# Patient Record
Sex: Female | Born: 1995 | Race: Black or African American | Hispanic: No | Marital: Single | Smoking: Former smoker
Health system: Southern US, Community
[De-identification: ages and names within clinical notes are randomized; demographics above are authoritative.]

## PROBLEM LIST (undated history)

## (undated) ENCOUNTER — Inpatient Hospital Stay (HOSPITAL_COMMUNITY): Payer: Self-pay

## (undated) DIAGNOSIS — N390 Urinary tract infection, site not specified: Secondary | ICD-10-CM

## (undated) DIAGNOSIS — B999 Unspecified infectious disease: Secondary | ICD-10-CM

## (undated) DIAGNOSIS — Z789 Other specified health status: Secondary | ICD-10-CM

## (undated) HISTORY — PX: NO PAST SURGERIES: SHX2092

---
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
## 2013-10-06 ENCOUNTER — Inpatient Hospital Stay (HOSPITAL_COMMUNITY)
Admission: AD | Admit: 2013-10-06 | Discharge: 2013-10-06 | Disposition: A | Discharge status: Home / Self Care | Payer: Medicaid Other | Source: Ambulatory Visit | Attending: Obstetrics | Admitting: Obstetrics

## 2013-10-06 ENCOUNTER — Encounter (HOSPITAL_COMMUNITY): Payer: Self-pay | Admitting: *Deleted

## 2013-10-06 DIAGNOSIS — N390 Urinary tract infection, site not specified: Secondary | ICD-10-CM | POA: Insufficient documentation

## 2013-10-06 DIAGNOSIS — B3731 Acute candidiasis of vulva and vagina: Secondary | ICD-10-CM | POA: Insufficient documentation

## 2013-10-06 DIAGNOSIS — N898 Other specified noninflammatory disorders of vagina: Secondary | ICD-10-CM | POA: Insufficient documentation

## 2013-10-06 DIAGNOSIS — B373 Candidiasis of vulva and vagina: Secondary | ICD-10-CM | POA: Insufficient documentation

## 2013-10-06 DIAGNOSIS — N760 Acute vaginitis: Secondary | ICD-10-CM

## 2013-10-06 LAB — URINE MICROSCOPIC-ADD ON

## 2013-10-06 LAB — URINALYSIS, ROUTINE W REFLEX MICROSCOPIC
BILIRUBIN URINE: NEGATIVE
Glucose, UA: NEGATIVE mg/dL
KETONES UR: NEGATIVE mg/dL
Nitrite: POSITIVE — AB
Protein, ur: NEGATIVE mg/dL
Specific Gravity, Urine: 1.015 (ref 1.005–1.030)
UROBILINOGEN UA: 0.2 mg/dL (ref 0.0–1.0)
pH: 6.5 (ref 5.0–8.0)

## 2013-10-06 LAB — POCT PREGNANCY, URINE: Preg Test, Ur: NEGATIVE

## 2013-10-06 LAB — WET PREP, GENITAL: Trich, Wet Prep: NONE SEEN

## 2013-10-06 MED ORDER — CEPHALEXIN 500 MG PO CAPS: 500.0000 mg | ORAL_CAPSULE | Freq: Two times a day (BID) | ORAL | Status: DC

## 2013-10-06 MED ORDER — FLUCONAZOLE 150 MG PO TABS
150.0000 mg | ORAL_TABLET | Freq: Once | ORAL | Status: AC
  Administered 2013-10-06: 150 mg via ORAL
  Filled 2013-10-06: qty 1

## 2013-10-06 NOTE — MAU Provider Note (Signed)
History     CSN: [id number]  Arrival date and time: 10/06/13 2036   First Provider Initiated Contact with Patient 10/06/13 2139      Chief Complaint  Patient presents with  . Vaginal Itching   Vaginal Itching    Pt is a G1P1001 here with report of vaginal discharge x 3 days.  Discharge is described as thick and white with positive vaginal itching.  No reported odor.  Denies vaginal lesions.    Past Medical History  Diagnosis Date  . Medical history non-contributory     Past Surgical History  Procedure Laterality Date  . No past surgeries      Family History  Problem Relation Age of Onset  . Asthma Son     History  Substance Use Topics  . Smoking status: Never Smoker   . Smokeless tobacco: Not on file  . Alcohol Use: No    Allergies: No Known Allergies  No prescriptions prior to admission    Review of Systems  Genitourinary:       Vaginal discharge and itching  All other systems reviewed and are negative.  Physical Exam   Last menstrual period 08/16/2013.  Physical Exam  Constitutional: She is oriented to person, place, and time. She appears well-developed and well-nourished. No distress.  HENT:  Head: Normocephalic.  Neck: Normal range of motion. Neck supple.  Cardiovascular: Normal rate, regular rhythm and normal heart sounds.   Respiratory: Effort normal and breath sounds normal. No respiratory distress.  GI: Soft. There is no tenderness. There is no CVA tenderness.  Genitourinary: Cervix exhibits no motion tenderness and no discharge. No bleeding around the vagina. Vaginal discharge (white, thick discharge) found.  Musculoskeletal: Normal range of motion.  Neurological: She is alert and oriented to person, place, and time.  Skin: Skin is warm and dry.  Psychiatric: She has a normal mood and affect.    MAU Course  Procedures  Results for orders placed during the hospital encounter of 10/06/13 (from the past 24 hour(s))  URINALYSIS, ROUTINE  W REFLEX MICROSCOPIC     Status: Abnormal   Collection Time    10/06/13  8:45 PM      Result Value Ref Range   Color, Urine YELLOW  YELLOW   APPearance CLEAR  CLEAR   Specific Gravity, Urine 1.015  1.005 - 1.030   pH 6.5  5.0 - 8.0   Glucose, UA NEGATIVE  NEGATIVE mg/dL   Hgb urine dipstick TRACE (*) NEGATIVE   Bilirubin Urine NEGATIVE  NEGATIVE   Ketones, ur NEGATIVE  NEGATIVE mg/dL   Protein, ur NEGATIVE  NEGATIVE mg/dL   Urobilinogen, UA 0.2  0.0 - 1.0 mg/dL   Nitrite POSITIVE (*) NEGATIVE   Leukocytes, UA SMALL (*) NEGATIVE  URINE MICROSCOPIC-ADD ON     Status: Abnormal   Collection Time    10/06/13  8:45 PM      Result Value Ref Range   Squamous Epithelial / LPF RARE  RARE   WBC, UA 0-2  <3 WBC/hpf   RBC / HPF 0-2  <3 RBC/hpf   Bacteria, UA MANY (*) RARE  POCT PREGNANCY, URINE     Status: None   Collection Time    10/06/13  9:02 PM      Result Value Ref Range   Preg Test, Ur NEGATIVE  NEGATIVE  WET PREP, GENITAL     Status: Abnormal   Collection Time    10/06/13  9:49 PM  Result Value Ref Range   Yeast Wet Prep HPF POC FEW (*) NONE SEEN   Trich, Wet Prep NONE SEEN  NONE SEEN   Clue Cells Wet Prep HPF POC FEW (*) NONE SEEN   WBC, Wet Prep HPF POC FEW (*) NONE SEEN    Assessment and Plan  UTI Yeast Infection  Plan:  Discharge to home RX Keflex 500 mg BID x 7 days Urine culture sent Diflucan 150 mg given in Maternity Admissions Unit  Rochele Pages N 10/06/2013, 9:40 PM

## 2013-10-09 LAB — URINE CULTURE

## 2013-12-09 ENCOUNTER — Encounter (HOSPITAL_COMMUNITY): Payer: Self-pay | Admitting: *Deleted

## 2014-01-23 ENCOUNTER — Encounter (HOSPITAL_COMMUNITY): Payer: Self-pay | Admitting: *Deleted

## 2014-01-23 ENCOUNTER — Inpatient Hospital Stay (HOSPITAL_COMMUNITY): Payer: Medicaid Other

## 2014-01-23 ENCOUNTER — Inpatient Hospital Stay (HOSPITAL_COMMUNITY)
Admission: AD | Admit: 2014-01-23 | Discharge: 2014-01-23 | Disposition: A | Discharge status: Home / Self Care | Payer: Medicaid Other | Source: Ambulatory Visit | Attending: Obstetrics | Admitting: Obstetrics

## 2014-01-23 DIAGNOSIS — Z3A00 Weeks of gestation of pregnancy not specified: Secondary | ICD-10-CM | POA: Insufficient documentation

## 2014-01-23 DIAGNOSIS — O4691 Antepartum hemorrhage, unspecified, first trimester: Secondary | ICD-10-CM

## 2014-01-23 DIAGNOSIS — O3680X0 Pregnancy with inconclusive fetal viability, not applicable or unspecified: Secondary | ICD-10-CM

## 2014-01-23 DIAGNOSIS — O209 Hemorrhage in early pregnancy, unspecified: Secondary | ICD-10-CM | POA: Insufficient documentation

## 2014-01-23 LAB — URINE MICROSCOPIC-ADD ON

## 2014-01-23 LAB — CBC
HEMATOCRIT: 40.3 % (ref 36.0–46.0)
HEMOGLOBIN: 13.5 g/dL (ref 12.0–15.0)
MCH: 31.4 pg (ref 26.0–34.0)
MCHC: 33.5 g/dL (ref 30.0–36.0)
MCV: 93.7 fL (ref 78.0–100.0)
Platelets: 311 10*3/uL (ref 150–400)
RBC: 4.3 MIL/uL (ref 3.87–5.11)
RDW: 12.6 % (ref 11.5–15.5)
WBC: 5.6 10*3/uL (ref 4.0–10.5)

## 2014-01-23 LAB — URINALYSIS, ROUTINE W REFLEX MICROSCOPIC
Bilirubin Urine: NEGATIVE
Glucose, UA: NEGATIVE mg/dL
KETONES UR: 15 mg/dL — AB
LEUKOCYTES UA: NEGATIVE
Nitrite: NEGATIVE
PROTEIN: 100 mg/dL — AB
Specific Gravity, Urine: >1.03 — ABNORMAL HIGH (ref 1.005–1.030)
Urobilinogen, UA: 0.2 mg/dL (ref 0.0–1.0)
pH: 6 (ref 5.0–8.0)

## 2014-01-23 LAB — WET PREP, GENITAL
Clue Cells Wet Prep HPF POC: NONE SEEN
Trich, Wet Prep: NONE SEEN
Yeast Wet Prep HPF POC: NONE SEEN

## 2014-01-23 LAB — HCG, QUANTITATIVE, PREGNANCY: hCG, Beta Chain, Quant, S: 92 m[IU]/mL — ABNORMAL HIGH (ref <5)

## 2014-01-23 LAB — POCT PREGNANCY, URINE: Preg Test, Ur: POSITIVE — AB

## 2014-01-23 NOTE — Discharge Instructions (Signed)
°Ectopic Pregnancy °An ectopic pregnancy is when the fertilized egg attaches (implants) outside the uterus. Most ectopic pregnancies occur in the fallopian tube. Rarely do ectopic pregnancies occur on the ovary, intestine, pelvis, or cervix. In an ectopic pregnancy, the fertilized egg does not have the ability to develop into a normal, healthy baby.  °A ruptured ectopic pregnancy is one in which the fallopian tube gets torn or bursts and results in internal bleeding. Often there is intense abdominal pain, and sometimes, vaginal bleeding. Having an ectopic pregnancy can be life threatening. If left untreated, this dangerous condition can lead to a blood transfusion, abdominal surgery, or even death. °CAUSES  °Damage to the fallopian tubes is the suspected cause in most ectopic pregnancies.  °RISK FACTORS °Depending on your circumstances, the risk of having an ectopic pregnancy will vary. The level of risk can be divided into three categories. °High Risk °· You have gone through infertility treatment. °· You have had a previous ectopic pregnancy. °· You have had previous tubal surgery. °· You have had previous surgery to have the fallopian tubes tied (tubal ligation). °· You have tubal problems or diseases. °· You have been exposed to DES. DES is a medicine that was used until 1971 and had effects on babies whose mothers took the medicine. °· You become pregnant while using an intrauterine device (IUD) for birth control.  °Moderate Risk °· You have a history of infertility. °· You have a history of a sexually transmitted infection (STI). °· You have a history of pelvic inflammatory disease (PID). °· You have scarring from endometriosis. °· You have multiple sexual partners. °· You smoke.  °Low Risk °· You have had previous pelvic surgery. °· You use vaginal douching. °· You became sexually active before 20 years of age. °SIGNS AND SYMPTOMS  °An ectopic pregnancy should be suspected in anyone who has missed a period  and has abdominal pain or bleeding. °· You may experience normal pregnancy symptoms, such as: °¨ Nausea. °¨ Tiredness. °¨ Breast tenderness. °· Other symptoms may include: °¨ Pain with intercourse. °¨ Irregular vaginal bleeding or spotting. °¨ Cramping or pain on one side or in the lower abdomen. °¨ Fast heartbeat. °¨ Passing out while having a bowel movement. °· Symptoms of a ruptured ectopic pregnancy and internal bleeding may include: °¨ Sudden, severe pain in the abdomen and pelvis. °¨ Dizziness or fainting. °¨ Pain in the shoulder area. °DIAGNOSIS  °Tests that may be performed include: °· A pregnancy test. °· An ultrasound test. °· Testing the specific level of pregnancy hormone in the bloodstream. °· Taking a sample of uterus tissue (dilation and curettage, D&C). °· Surgery to perform a visual exam of the inside of the abdomen using a thin, lighted tube with a tiny camera on the end (laparoscope). °TREATMENT  °An injection of a medicine called methotrexate may be given. This medicine causes the pregnancy tissue to be absorbed. It is given if: °· The diagnosis is made early. °· The fallopian tube has not ruptured. °· You are considered to be a good candidate for the medicine. °Usually, pregnancy hormone blood levels are checked after methotrexate treatment. This is to be sure the medicine is effective. It may take 4-6 weeks for the pregnancy to be absorbed (though most pregnancies will be absorbed by 3 weeks). °Surgical treatment may be needed. A laparoscope may be used to remove the pregnancy tissue. If severe internal bleeding occurs, a cut (incision) may be made in the lower abdomen (laparotomy), and the ectopic   pregnancy is removed. This stops the bleeding. Part of the fallopian tube, or the whole tube, may be removed as well (salpingectomy). After surgery, pregnancy hormone tests may be done to be sure there is no pregnancy tissue left. You may receive a Rho (D) immune globulin shot if you are Rh negative  and the father is Rh positive, or if you do not know the Rh type of the father. This is to prevent problems with any future pregnancy. °SEEK IMMEDIATE MEDICAL CARE IF:  °You have any symptoms of an ectopic pregnancy. This is a medical emergency. °MAKE SURE YOU: °· Understand these instructions. °· Will watch your condition. °· Will get help right away if you are not doing well or get worse. °Document Released: 03/03/2004 Document Revised: 06/10/2013 Document Reviewed: 08/23/2012 °ExitCare® Patient Information ©2015 [employer]. This information is not intended to replace advice given to you by your health care provider. Make sure you discuss any questions you have with your health care provider. ° ° °

## 2014-01-23 NOTE — MAU Note (Signed)
Pt states she noticed vaginal bleeding yesterday around 1830 and then it started and pt states she just sees spotting now

## 2014-01-23 NOTE — MAU Provider Note (Signed)
History     CSN: [id number]  Arrival date and time: 01/23/14 1439   First Provider Initiated Contact with Patient 01/23/14 1709      Chief Complaint  Patient presents with  . Vaginal Bleeding   HPI Maria Sanders is a ,18 y.o. G1P1001 at Unknown who presents today with vaginal bleeding. She states that she has been bleeding for about two days. She denies any pain at this time. She had a +HPT two days ago. She states that her LMP was 12/15/13.   Past Medical History  Diagnosis Date  . Medical history non-contributory     Past Surgical History  Procedure Laterality Date  . No past surgeries      Family History  Problem Relation Age of Onset  . Asthma Son     History  Substance Use Topics  . Smoking status: Never Smoker   . Smokeless tobacco: Not on file  . Alcohol Use: No    Allergies: No Known Allergies  Prescriptions prior to admission  Medication Sig Dispense Refill Last Dose  . cephALEXin (KEFLEX) 500 MG capsule Take 1 capsule (500 mg total) by mouth 2 (two) times daily. (Patient not taking: Reported on 01/23/2014) 14 capsule 0 Completed Course at Unknown time    ROS Physical Exam   Blood pressure 109/63, pulse 99, temperature 98.1 F (36.7 C), temperature source Oral, resp. rate 18, height 5' (1.524 m), weight 47.9 kg (105 lb 9.6 oz), last menstrual period 12/15/2013.  Physical Exam  Nursing note and vitals reviewed. Constitutional: She is oriented to person, place, and time. She appears well-developed and well-nourished. No distress.  Cardiovascular: Normal rate.   Respiratory: Effort normal.  GI: Soft. There is no tenderness. There is no rebound.  Genitourinary:   External: no lesion Vagina: small amount of dark red blood.  Cervix: pink, smooth, no CMT Uterus: NSSC Adnexa: NT   Neurological: She is alert and oriented to person, place, and time.  Skin: Skin is warm and dry.  Psychiatric: She has a normal mood and affect.    MAU Course   Procedures  Results for orders placed or performed during the hospital encounter of 01/23/14 (from the past 24 hour(s))  Urinalysis, Routine w reflex microscopic     Status: Abnormal   Collection Time: 01/23/14  3:30 PM  Result Value Ref Range   Color, Urine YELLOW YELLOW   APPearance HAZY (A) CLEAR   Specific Gravity, Urine >1.030 (H) 1.005 - 1.030   pH 6.0 5.0 - 8.0   Glucose, UA NEGATIVE NEGATIVE mg/dL   Hgb urine dipstick SMALL (A) NEGATIVE   Bilirubin Urine NEGATIVE NEGATIVE   Ketones, ur 15 (A) NEGATIVE mg/dL   Protein, ur 409100 (A) NEGATIVE mg/dL   Urobilinogen, UA 0.2 0.0 - 1.0 mg/dL   Nitrite NEGATIVE NEGATIVE   Leukocytes, UA NEGATIVE NEGATIVE  Urine microscopic-add on     Status: Abnormal   Collection Time: 01/23/14  3:30 PM  Result Value Ref Range   Squamous Epithelial / LPF FEW (A) RARE   WBC, UA 0-2 <3 WBC/hpf   Urine-Other MUCOUS PRESENT   Pregnancy, urine POC     Status: Abnormal   Collection Time: 01/23/14  3:33 PM  Result Value Ref Range   Preg Test, Ur POSITIVE (A) NEGATIVE  CBC     Status: None   Collection Time: 01/23/14  5:07 PM  Result Value Ref Range   WBC 5.6 4.0 - 10.5 K/uL   RBC 4.30 3.87 -  5.11 MIL/uL   Hemoglobin 13.5 12.0 - 15.0 g/dL   HCT 16.140.3 09.636.0 - 04.546.0 %   MCV 93.7 78.0 - 100.0 fL   MCH 31.4 26.0 - 34.0 pg   MCHC 33.5 30.0 - 36.0 g/dL   RDW 40.912.6 81.111.5 - 91.415.5 %   Platelets 311 150 - 400 K/uL  hCG, quantitative, pregnancy     Status: Abnormal   Collection Time: 01/23/14  5:07 PM  Result Value Ref Range   hCG, Beta Chain, Quant, S 92 (H) <5 mIU/mL  Wet prep, genital     Status: Abnormal   Collection Time: 01/23/14  5:10 PM  Result Value Ref Range   Yeast Wet Prep HPF POC NONE SEEN NONE SEEN   Trich, Wet Prep NONE SEEN NONE SEEN   Clue Cells Wet Prep HPF POC NONE SEEN NONE SEEN   WBC, Wet Prep HPF POC FEW (A) NONE SEEN   [place] Ob Comp Less 14 Wks  01/23/2014   CLINICAL DATA:  Pregnant, bleeding  EXAM: OBSTETRIC <14 WK US AND TRANSVAGINAL  OB US  TECHNIQUE: Both transabdominal and transvaginal ultrasound examinations were performed for complete evaluation of the gestation as well as the maternal uterus, adnexal regions, and pelvic cul-de-sac. Transvaginal technique was performed to assess early pregnancy.  COMPARISON:  None.  FINDINGS: Intrauterine gestational sac: Not visualized  Maternal uterus/adnexae: Endometrial complex measures 10 mm.  Left ovary is within normal limits, measuring 2.7 x 1.6 x 1.4 cm.  Right ovary is within normal limits, measuring 3.0 x 1.3 x 1.3 cm.  No free fluid.  IMPRESSION: No IUP is visualized.  By definition, in the setting of a positive pregnancy test, this reflects a pregnancy of unknown location. Differential considerations include an early normal IUP, abnormal IUP/missed abortion, or nonvisualized ectopic pregnancy. Correlate with beta HCG.  Serial beta HCG is suggested, supplemented by repeat pelvic sonography in 14 days.   Electronically Signed   By: Charline BillsSriyesh  Krishnan M.D.   On: 01/23/2014 17:45   [place] Ob Transvaginal  01/23/2014   CLINICAL DATA:  Pregnant, bleeding  EXAM: OBSTETRIC <14 WK US AND TRANSVAGINAL OB US  TECHNIQUE: Both transabdominal and transvaginal ultrasound examinations were performed for complete evaluation of the gestation as well as the maternal uterus, adnexal regions, and pelvic cul-de-sac. Transvaginal technique was performed to assess early pregnancy.  COMPARISON:  None.  FINDINGS: Intrauterine gestational sac: Not visualized  Maternal uterus/adnexae: Endometrial complex measures 10 mm.  Left ovary is within normal limits, measuring 2.7 x 1.6 x 1.4 cm.  Right ovary is within normal limits, measuring 3.0 x 1.3 x 1.3 cm.  No free fluid.  IMPRESSION: No IUP is visualized.  By definition, in the setting of a positive pregnancy test, this reflects a pregnancy of unknown location. Differential considerations include an early normal IUP, abnormal IUP/missed abortion, or nonvisualized ectopic  pregnancy. Correlate with beta HCG.  Serial beta HCG is suggested, supplemented by repeat pelvic sonography in 14 days.   Electronically Signed   By: Charline BillsSriyesh  Krishnan M.D.   On: 01/23/2014 17:45     Assessment and Plan   1. Pregnancy of unknown anatomic location   2. Vaginal bleeding in pregnancy, first trimester    Ectopic precautions  Return to MAU in 49 hours for repeat HCG   Tawnya CrookHogan, Heather Donovan 01/23/2014, 5:29 PM

## 2014-01-23 NOTE — MAU Note (Signed)
Pt reports seh had positive HPT 2 days ago. Had some vag bleeding yesterday none today. Denies pain or cramping.

## 2014-01-24 ENCOUNTER — Inpatient Hospital Stay (HOSPITAL_COMMUNITY)
Admission: AD | Admit: 2014-01-24 | Discharge: 2014-01-24 | Disposition: A | Discharge status: Home / Self Care | Payer: Medicaid Other | Source: Ambulatory Visit | Attending: Obstetrics | Admitting: Obstetrics

## 2014-01-24 ENCOUNTER — Encounter (HOSPITAL_COMMUNITY): Payer: Self-pay | Admitting: *Deleted

## 2014-01-24 DIAGNOSIS — O039 Complete or unspecified spontaneous abortion without complication: Secondary | ICD-10-CM

## 2014-01-24 DIAGNOSIS — Z3A01 Less than 8 weeks gestation of pregnancy: Secondary | ICD-10-CM | POA: Insufficient documentation

## 2014-01-24 LAB — GC/CHLAMYDIA PROBE AMP
CT Probe RNA: NEGATIVE
GC PROBE AMP APTIMA: NEGATIVE

## 2014-01-24 LAB — HIV ANTIBODY (ROUTINE TESTING W REFLEX): HIV 1&2 Ab, 4th Generation: NONREACTIVE

## 2014-01-24 LAB — HCG, QUANTITATIVE, PREGNANCY: HCG, BETA CHAIN, QUANT, S: 59 m[IU]/mL — AB (ref <5)

## 2014-01-24 NOTE — MAU Note (Signed)
Was seen here yesterday. Had found out was pregnant 3-4 days ago and started bleeding Weds. Was told to come back tomorrow. States someone called to check on her today and pt told the person her bleeding was heavier so the caller told pt to come in today.

## 2014-01-24 NOTE — MAU Provider Note (Signed)
CSN: [id number]     Arrival date & time 01/24/14  [id number] History   None    Chief Complaint  Patient presents with  . Vaginal Bleeding     (Consider location/radiation/quality/duration/timing/severity/associated sxs/prior Treatment) HPI  Maria Sanders is a 18 y.o. G2P1001 @ 247w5d gestation who presents to the MAU for follow up. She was evaluated here yesterday and had an ultrasound that showed no IUP or adnexal mass. Her Bhcg was 92. She returns today because she called Dr. Elsie StainMarshall's office and told them the bleeding had increased and they told her to come back to MAU. Moderate bleeding, mild cramping.   Past Medical History  Diagnosis Date  . Medical history non-contributory    Past Surgical History  Procedure Laterality Date  . No past surgeries     Family History  Problem Relation Age of Onset  . Asthma Son    History  Substance Use Topics  . Smoking status: Never Smoker   . Smokeless tobacco: Not on file  . Alcohol Use: No   OB History    Gravida Para Term Preterm AB TAB SAB Ectopic Multiple Living   2 1 1       1      Review of Systems Negative except as stated in HPI   Allergies  Review of patient's allergies indicates no known allergies.  Home Medications   Prior to Admission medications   Medication Sig Start Date End Date Taking? Authorizing Provider  cephALEXin (KEFLEX) 500 MG capsule Take 1 capsule (500 mg total) by mouth 2 (two) times daily. Patient not taking: Reported on 01/23/2014 10/06/13   Marlis EdelsonWalidah N Karim, CNM   BP 107/71 mmHg  Pulse 72  Temp(Src) 98.2 F (36.8 C)  Resp 18  Ht 5' (1.524 m)  Wt 105 lb 12.8 oz (47.991 kg)  BMI 20.66 kg/m2  LMP 12/15/2013 Physical Exam  Constitutional: She is oriented to person, place, and time. She appears well-developed and well-nourished.  HENT:  Head: Normocephalic.  Eyes: EOM are normal.  Neck: Neck supple.  Cardiovascular: Normal rate.   Pulmonary/Chest: Effort normal.  Abdominal: Soft. There is  no tenderness.  Unable to reproduce the cramping pain.  Musculoskeletal: Normal range of motion.  Neurological: She is alert and oriented to person, place, and time. No cranial nerve deficit.  Skin: Skin is warm and dry.  Psychiatric: She has a normal mood and affect. Her behavior is normal.  Nursing note and vitals reviewed.   ED Course  Procedures (including critical care time) Labs Review Labs Reviewed  HCG, QUANTITATIVE, PREGNANCY - Abnormal; Notable for the following:    hCG, Beta Chain, Quant, S 59 (*)    All other components within normal limits    Imaging Review [place] Ob Comp Less 14 Wks  01/23/2014   CLINICAL DATA:  Pregnant, bleeding  EXAM: OBSTETRIC <14 WK US AND TRANSVAGINAL OB US  TECHNIQUE: Both transabdominal and transvaginal ultrasound examinations were performed for complete evaluation of the gestation as well as the maternal uterus, adnexal regions, and pelvic cul-de-sac. Transvaginal technique was performed to assess early pregnancy.  COMPARISON:  None.  FINDINGS: Intrauterine gestational sac: Not visualized  Maternal uterus/adnexae: Endometrial complex measures 10 mm.  Left ovary is within normal limits, measuring 2.7 x 1.6 x 1.4 cm.  Right ovary is within normal limits, measuring 3.0 x 1.3 x 1.3 cm.  No free fluid.  IMPRESSION: No IUP is visualized.  By definition, in the setting of a positive pregnancy test,  this reflects a pregnancy of unknown location. Differential considerations include an early normal IUP, abnormal IUP/missed abortion, or nonvisualized ectopic pregnancy. Correlate with beta HCG.  Serial beta HCG is suggested, supplemented by repeat pelvic sonography in 14 days.   Electronically Signed   By: Charline BillsSriyesh  Krishnan M.D.   On: 01/23/2014 17:45   [place] Ob Transvaginal  01/23/2014   CLINICAL DATA:  Pregnant, bleeding  EXAM: OBSTETRIC <14 WK US AND TRANSVAGINAL OB US  TECHNIQUE: Both transabdominal and transvaginal ultrasound examinations were performed for complete  evaluation of the gestation as well as the maternal uterus, adnexal regions, and pelvic cul-de-sac. Transvaginal technique was performed to assess early pregnancy.  COMPARISON:  None.  FINDINGS: Intrauterine gestational sac: Not visualized  Maternal uterus/adnexae: Endometrial complex measures 10 mm.  Left ovary is within normal limits, measuring 2.7 x 1.6 x 1.4 cm.  Right ovary is within normal limits, measuring 3.0 x 1.3 x 1.3 cm.  No free fluid.  IMPRESSION: No IUP is visualized.  By definition, in the setting of a positive pregnancy test, this reflects a pregnancy of unknown location. Differential considerations include an early normal IUP, abnormal IUP/missed abortion, or nonvisualized ectopic pregnancy. Correlate with beta HCG.  Serial beta HCG is suggested, supplemented by repeat pelvic sonography in 14 days.   Electronically Signed   By: Charline BillsSriyesh  Krishnan M.D.   On: 01/23/2014 17:45   Results for orders placed or performed during the hospital encounter of 01/24/14 (from the past 24 hour(s))  hCG, quantitative, pregnancy     Status: Abnormal   Collection Time: 01/24/14  8:00 PM  Result Value Ref Range   hCG, Beta Chain, Quant, S 59 (H) <5 mIU/mL   Bhcg dropped from 92 yesterday to 59 today.  MDM  18 y.o. female with bleeding in early pregnancy. Cramping like period cramps. Stable for discharge without acute abdomen. Drop in Bhcg most like SAB. I discussed results with the patient and need for follow up in one week to make sure the number goes to zero. Patient voices understanding and will call Dr. Elsie StainMarshall's office for an appointment. She will return here sooner for any problems.  Final diagnoses:  Miscarriage

## 2014-01-24 NOTE — Discharge Instructions (Signed)
Your pregnancy hormone level has dropped from 92 yesterday to 59 today. This is most likely a miscarriage. Make an appointment to follow up with Dr. Gaynell FaceMarshall in one week or return here sooner for any problems.   Miscarriage A miscarriage is the loss of an unborn baby (fetus) before the 20th week of pregnancy. The cause is often unknown.  HOME CARE  You may need to stay in bed (bed rest), or you may be able to do light activity. Go about activity as told by your doctor.  Have help at home.  Write down how many pads you use each day. Write down how soaked they are.  Do not use tampons. Do not wash out your vagina (douche) or have sex (intercourse) until your doctor approves.  Only take medicine as told by your doctor.  Do not take aspirin.  Keep all doctor visits as told.  If you or your partner have problems with grieving, talk to your doctor. You can also try counseling. Give yourself time to grieve before trying to get pregnant again. GET HELP RIGHT AWAY IF:  You have bad cramps or pain in your back or belly (abdomen).  You have a fever.  You pass large clumps of blood (clots) from your vagina that are walnut-sized or larger. Save the clumps for your doctor to see.  You pass large amounts of tissue from your vagina. Save the tissue for your doctor to see.  You have more bleeding.  You have thick, bad-smelling fluid (discharge) coming from the vagina.  You get lightheaded, weak, or you pass out (faint).  You have chills. MAKE SURE YOU:  Understand these instructions.  Will watch your condition.  Will get help right away if you are not doing well or get worse. Document Released: 04/18/2011 Document Reviewed: 04/18/2011 [facility] Patient Information 2015 [place], [place]. This information is not intended to replace advice given to you by your health care provider. Make sure you discuss any questions you have with your health care provider.

## 2014-02-07 NOTE — L&D Delivery Note (Signed)
Delivery Note At 10:47 PM a viable female was delivered via Vaginal, Spontaneous Delivery (Presentation: Left Occiput Anterior).  APGAR:8 ,9 ; weight  .   Placenta status: Intact, Spontaneous.  Cord: 3 vessels with the following complications: None. Mom GBS positive. Only received Penicillin G x 1.     Anesthesia: Epidural  Episiotomy: None Lacerations: None Suture Repair: None Est. Blood Loss (mL):    Mom to postpartum.  Baby to Couplet care / Skin to Skin.  Calen Geister 12/27/2014, 11:02 PM   Called to delivery. Mother pushed over 30 minutes. Infant delivered to maternal abdomen. Cord clamped and cut. Active management of 3rd stage with traction and Pitocin. Placenta delivered intact with 3v cord. No tears. EBL25cc. Counts correct. Hemostatic.   Yolande Jollyaleb G Duell Holdren, MD Family Medicine - PGY 2

## 2014-06-17 ENCOUNTER — Inpatient Hospital Stay (HOSPITAL_COMMUNITY)
Admission: AD | Admit: 2014-06-17 | Discharge: 2014-06-17 | Disposition: A | Discharge status: Home / Self Care | Payer: Self-pay | Source: Ambulatory Visit | Attending: Family Medicine | Admitting: Family Medicine

## 2014-06-17 ENCOUNTER — Encounter (HOSPITAL_COMMUNITY): Payer: Self-pay | Admitting: *Deleted

## 2014-06-17 ENCOUNTER — Inpatient Hospital Stay (HOSPITAL_COMMUNITY): Payer: Self-pay

## 2014-06-17 DIAGNOSIS — B3731 Acute candidiasis of vulva and vagina: Secondary | ICD-10-CM

## 2014-06-17 DIAGNOSIS — Z3A12 12 weeks gestation of pregnancy: Secondary | ICD-10-CM | POA: Insufficient documentation

## 2014-06-17 DIAGNOSIS — B373 Candidiasis of vulva and vagina: Secondary | ICD-10-CM

## 2014-06-17 DIAGNOSIS — O9989 Other specified diseases and conditions complicating pregnancy, childbirth and the puerperium: Secondary | ICD-10-CM | POA: Insufficient documentation

## 2014-06-17 DIAGNOSIS — O26899 Other specified pregnancy related conditions, unspecified trimester: Secondary | ICD-10-CM

## 2014-06-17 DIAGNOSIS — R109 Unspecified abdominal pain: Secondary | ICD-10-CM | POA: Insufficient documentation

## 2014-06-17 DIAGNOSIS — L293 Anogenital pruritus, unspecified: Secondary | ICD-10-CM | POA: Insufficient documentation

## 2014-06-17 DIAGNOSIS — O2342 Unspecified infection of urinary tract in pregnancy, second trimester: Secondary | ICD-10-CM

## 2014-06-17 LAB — URINE MICROSCOPIC-ADD ON

## 2014-06-17 LAB — HCG, QUANTITATIVE, PREGNANCY: HCG, BETA CHAIN, QUANT, S: 169420 m[IU]/mL — AB (ref <5)

## 2014-06-17 LAB — CBC
HEMATOCRIT: 35.4 % — AB (ref 36.0–46.0)
HEMOGLOBIN: 12.2 g/dL (ref 12.0–15.0)
MCH: 31 pg (ref 26.0–34.0)
MCHC: 34.5 g/dL (ref 30.0–36.0)
MCV: 89.8 fL (ref 78.0–100.0)
Platelets: 282 10*3/uL (ref 150–400)
RBC: 3.94 MIL/uL (ref 3.87–5.11)
RDW: 12.4 % (ref 11.5–15.5)
WBC: 9.2 10*3/uL (ref 4.0–10.5)

## 2014-06-17 LAB — WET PREP, GENITAL
Clue Cells Wet Prep HPF POC: NONE SEEN
Trich, Wet Prep: NONE SEEN

## 2014-06-17 LAB — URINALYSIS, ROUTINE W REFLEX MICROSCOPIC
BILIRUBIN URINE: NEGATIVE
Glucose, UA: NEGATIVE mg/dL
Ketones, ur: NEGATIVE mg/dL
Nitrite: POSITIVE — AB
PH: 6.5 (ref 5.0–8.0)
Protein, ur: NEGATIVE mg/dL
SPECIFIC GRAVITY, URINE: 1.02 (ref 1.005–1.030)
Urobilinogen, UA: 0.2 mg/dL (ref 0.0–1.0)

## 2014-06-17 LAB — POCT PREGNANCY, URINE: Preg Test, Ur: POSITIVE — AB

## 2014-06-17 MED ORDER — FLUCONAZOLE 150 MG PO TABS: 150.0000 mg | ORAL_TABLET | Freq: Once | ORAL | Status: DC

## 2014-06-17 MED ORDER — NITROFURANTOIN MONOHYD MACRO 100 MG PO CAPS: 100.0000 mg | ORAL_CAPSULE | Freq: Two times a day (BID) | ORAL | Status: DC

## 2014-06-17 NOTE — Progress Notes (Signed)
S. Lineberry NP in earlier to discuss test results and d/c plan. Written and verbal d/c instructions given and understanding voiced. 

## 2014-06-17 NOTE — MAU Provider Note (Signed)
History     CSN: [id number]  Arrival date and time: 06/17/14 1347   First Provider Initiated Contact with Patient 06/17/14 1450      Chief Complaint  Patient presents with  . Vaginal Itching   HPIpt is G2P1SAB1  Vomited 2 days ago- no appetite Vaginal itching x 2 days with worsening sx. Vaginal spotting abd cramping Having vaginal itching and burning, started last night. Urine has a strong smell- noted about a wk ago. Denies any urinary symptoms  Past Medical History  Diagnosis Date  . Medical history non-contributory     Past Surgical History  Procedure Laterality Date  . No past surgeries      Family History  Problem Relation Age of Onset  . Asthma Son     History  Substance Use Topics  . Smoking status: Former Smoker    Types: Cigarettes  . Smokeless tobacco: Not on file  . Alcohol Use: No    Allergies: No Known Allergies  No prescriptions prior to admission    Review of Systems  Constitutional: Negative for fever and chills.  Gastrointestinal: Positive for nausea, vomiting and abdominal pain.  Genitourinary: Negative for flank pain.   Physical Exam   Blood pressure 117/65, pulse 92, temperature 98.3 F (36.8 C), temperature source Oral, resp. rate 16, height 4\' 11"  (1.499 m), weight 97 lb (43.999 kg), last menstrual period 04/21/2014, unknown if currently breastfeeding.  Physical Exam  Constitutional: She is oriented to person, place, and time. She appears well-developed and well-nourished. No distress.  HENT:  Head: Normocephalic.  Eyes: Pupils are equal, round, and reactive to light.  Neck: Normal range of motion. Neck supple.  Cardiovascular: Normal rate.   Respiratory: Effort normal.  GI: Soft.  Genitourinary:  Small amount of white discharge in vault; no evidence of any bleeding; cervix closed, NT; uterus 10-12 weeks ; adnexa without palpable enlargement  Musculoskeletal: Normal range of motion.  Neurological: She is alert and oriented  to person, place, and time.  Skin: Skin is warm and dry.  Psychiatric: She has a normal mood and affect.    MAU Course  Procedures Results for orders placed or performed during the hospital encounter of 06/17/14 (from the past 24 hour(s))  Urinalysis, Routine w reflex microscopic     Status: Abnormal   Collection Time: 06/17/14  2:00 PM  Result Value Ref Range   Color, Urine YELLOW YELLOW   APPearance HAZY (A) CLEAR   Specific Gravity, Urine 1.020 1.005 - 1.030   pH 6.5 5.0 - 8.0   Glucose, UA NEGATIVE NEGATIVE mg/dL   Hgb urine dipstick SMALL (A) NEGATIVE   Bilirubin Urine NEGATIVE NEGATIVE   Ketones, ur NEGATIVE NEGATIVE mg/dL   Protein, ur NEGATIVE NEGATIVE mg/dL   Urobilinogen, UA 0.2 0.0 - 1.0 mg/dL   Nitrite POSITIVE (A) NEGATIVE   Leukocytes, UA MODERATE (A) NEGATIVE  Urine microscopic-add on     Status: Abnormal   Collection Time: 06/17/14  2:00 PM  Result Value Ref Range   Squamous Epithelial / LPF MANY (A) RARE   WBC, UA 3-6 <3 WBC/hpf   RBC / HPF 3-6 <3 RBC/hpf   Bacteria, UA MANY (A) RARE  Pregnancy, urine POC     Status: Abnormal   Collection Time: 06/17/14  2:16 PM  Result Value Ref Range   Preg Test, Ur POSITIVE (A) NEGATIVE  Wet prep, genital     Status: Abnormal   Collection Time: 06/17/14  2:55 PM  Result Value Ref  Range   Yeast Wet Prep HPF POC FEW (A) NONE SEEN   Trich, Wet Prep NONE SEEN NONE SEEN   Clue Cells Wet Prep HPF POC NONE SEEN NONE SEEN   WBC, Wet Prep HPF POC FEW (A) NONE SEEN  CBC     Status: Abnormal   Collection Time: 06/17/14  3:03 PM  Result Value Ref Range   WBC 9.2 4.0 - 10.5 K/uL   RBC 3.94 3.87 - 5.11 MIL/uL   Hemoglobin 12.2 12.0 - 15.0 g/dL   HCT 16.135.4 (L) 09.636.0 - 04.546.0 %   MCV 89.8 78.0 - 100.0 fL   MCH 31.0 26.0 - 34.0 pg   MCHC 34.5 30.0 - 36.0 g/dL   RDW 40.912.4 81.111.5 - 91.415.5 %   Platelets 282 150 - 400 K/uL  hCG, quantitative, pregnancy     Status: Abnormal   Collection Time: 06/17/14  3:03 PM  Result Value Ref Range    hCG, Beta Chain, Quant, S [id number] (H) <5 mIU/mL  GC/chlamydia and urine culture pending [place] Ob Comp Less 14 Wks  06/17/2014   CLINICAL DATA:  Abdominal cramping.  EXAM: OBSTETRIC <14 WK ULTRASOUND  TECHNIQUE: Transabdominal ultrasound was performed for evaluation of the gestation as well as the maternal uterus and adnexal regions.  COMPARISON:  01/23/2014.  FINDINGS: Intrauterine gestational sac: Visualized/normal in shape.  Yolk sac:  Present  Embryo:  Present  Cardiac Activity: Present  Heart Rate: 172 bpm  CRL:   60.5  mm   12 w 4 d                  US EDC: 12/26/2014  Maternal uterus/adnexae: No subchorionic hemorrhage. The ovaries have a normal physiologic appearance. RIGHT ovary measures 20 mm x 8 mm x 9 mm. LEFT ovary measures 29 mm x 13 mm x 17 mm.  IMPRESSION: Uncomplicated single intrauterine pregnancy.   Electronically Signed   By: Andreas NewportGeoffrey  Lamke M.D.   On: 06/17/2014 16:34   Assessment and Plan  UTI in pregnancy-macrobid BID for 7 days Yeast vaginal infection in pregnancy- Diflucan 150mg   SLIUP 9677w4d F[facility]  [facility] HospitalINEBERRY,Marisel Tostenson 06/17/2014, 2:51 PM

## 2014-06-17 NOTE — Discharge Instructions (Signed)

## 2014-06-17 NOTE — MAU Note (Signed)
Having vaginal itching and burning, started last night.  Urine has a strong smell- noted about a wk ago.  Denies any urinary symptoms

## 2014-06-18 LAB — GC/CHLAMYDIA PROBE AMP (~~LOC~~) NOT AT ARMC
CHLAMYDIA, DNA PROBE: NEGATIVE
Neisseria Gonorrhea: NEGATIVE

## 2014-06-18 LAB — RPR: RPR Ser Ql: NONREACTIVE

## 2014-06-18 LAB — HIV ANTIBODY (ROUTINE TESTING W REFLEX): HIV Screen 4th Generation wRfx: NONREACTIVE

## 2014-08-14 ENCOUNTER — Other Ambulatory Visit (HOSPITAL_COMMUNITY): Payer: Self-pay | Admitting: Nurse Practitioner

## 2014-08-14 DIAGNOSIS — Z3689 Encounter for other specified antenatal screening: Secondary | ICD-10-CM

## 2014-08-19 ENCOUNTER — Ambulatory Visit (HOSPITAL_COMMUNITY)
Admission: RE | Admit: 2014-08-19 | Discharge: 2014-08-19 | Disposition: A | Discharge status: Home / Self Care | Payer: Self-pay | Source: Ambulatory Visit | Attending: Nurse Practitioner | Admitting: Nurse Practitioner

## 2014-08-19 DIAGNOSIS — Z36 Encounter for antenatal screening of mother: Secondary | ICD-10-CM | POA: Insufficient documentation

## 2014-08-19 DIAGNOSIS — Z3689 Encounter for other specified antenatal screening: Secondary | ICD-10-CM

## 2014-08-19 DIAGNOSIS — Z3A21 21 weeks gestation of pregnancy: Secondary | ICD-10-CM | POA: Insufficient documentation

## 2014-10-19 ENCOUNTER — Inpatient Hospital Stay (HOSPITAL_COMMUNITY)
Admission: AD | Admit: 2014-10-19 | Discharge: 2014-10-19 | Disposition: A | Discharge status: Home / Self Care | Payer: Self-pay | Source: Ambulatory Visit | Attending: Obstetrics & Gynecology | Admitting: Obstetrics & Gynecology

## 2014-10-19 DIAGNOSIS — O4703 False labor before 37 completed weeks of gestation, third trimester: Secondary | ICD-10-CM

## 2014-10-19 DIAGNOSIS — Z87891 Personal history of nicotine dependence: Secondary | ICD-10-CM | POA: Insufficient documentation

## 2014-10-19 DIAGNOSIS — Z3A30 30 weeks gestation of pregnancy: Secondary | ICD-10-CM | POA: Insufficient documentation

## 2014-10-19 LAB — WET PREP, GENITAL
Trich, Wet Prep: NONE SEEN
YEAST WET PREP: NONE SEEN

## 2014-10-19 LAB — URINALYSIS, ROUTINE W REFLEX MICROSCOPIC
Bilirubin Urine: NEGATIVE
GLUCOSE, UA: NEGATIVE mg/dL
Hgb urine dipstick: NEGATIVE
KETONES UR: NEGATIVE mg/dL
Nitrite: POSITIVE — AB
PH: 6.5 (ref 5.0–8.0)
Protein, ur: NEGATIVE mg/dL
SPECIFIC GRAVITY, URINE: 1.015 (ref 1.005–1.030)
Urobilinogen, UA: 1 mg/dL (ref 0.0–1.0)

## 2014-10-19 LAB — URINE MICROSCOPIC-ADD ON

## 2014-10-19 NOTE — MAU Note (Signed)
Pt also states has bumps on arms and abdomen that has gotten worse over past week. No other persons in home have bumps.

## 2014-10-19 NOTE — Discharge Instructions (Signed)
Braxton Hicks Contractions °Contractions of the uterus can occur throughout pregnancy. Contractions are not always a sign that you are in labor.  °WHAT ARE BRAXTON HICKS CONTRACTIONS?  °Contractions that occur before labor are called Braxton Hicks contractions, or false labor. Toward the end of pregnancy (32-34 weeks), these contractions can develop more often and may become more forceful. This is not true labor because these contractions do not result in opening (dilatation) and thinning of the cervix. They are sometimes difficult to tell apart from true labor because these contractions can be forceful and people have different pain tolerances. You should not feel embarrassed if you go to the hospital with false labor. Sometimes, the only way to tell if you are in true labor is for your health care provider to look for changes in the cervix. °If there are no prenatal problems or other health problems associated with the pregnancy, it is completely safe to be sent home with false labor and await the onset of true labor. °HOW CAN YOU TELL THE DIFFERENCE BETWEEN TRUE AND FALSE LABOR? °False Labor °· The contractions of false labor are usually shorter and not as hard as those of true labor.   °· The contractions are usually irregular.   °· The contractions are often felt in the front of the lower abdomen and in the groin.   °· The contractions may go away when you walk around or change positions while lying down.   °· The contractions get weaker and are shorter lasting as time goes on.   °· The contractions do not usually become progressively stronger, regular, and closer together as with true labor.   °True Labor °· Contractions in true labor last 30-70 seconds, become very regular, usually become more intense, and increase in frequency.   °· The contractions do not go away with walking.   °· The discomfort is usually felt in the top of the uterus and spreads to the lower abdomen and low back.   °· True labor can be  determined by your health care provider with an exam. This will show that the cervix is dilating and getting thinner.   °WHAT TO REMEMBER °· Keep up with your usual exercises and follow other instructions given by your health care provider.   °· Take medicines as directed by your health care provider.   °· Keep your regular prenatal appointments.   °· Eat and drink lightly if you think you are going into labor.   °· If Braxton Hicks contractions are making you uncomfortable:   °¨ Change your position from lying down or resting to walking, or from walking to resting.   °¨ Sit and rest in a tub of warm water.   °¨ Drink 2-3 glasses of water. Dehydration may cause these contractions.   °¨ Do slow and deep breathing several times an hour.   °WHEN SHOULD I SEEK IMMEDIATE MEDICAL CARE? °Seek immediate medical care if: °· Your contractions become stronger, more regular, and closer together.   °· You have fluid leaking or gushing from your vagina.   °· You have a fever.   °· You pass blood-tinged mucus.   °· You have vaginal bleeding.   °· You have continuous abdominal pain.   °· You have low back pain that you never had before.   °· You feel your baby's head pushing down and causing pelvic pressure.   °· Your baby is not moving as much as it used to.   °Document Released: 01/24/2005 Document Revised: 01/29/2013 Document Reviewed: 11/05/2012 °[employer]® Patient Information ©2015 [employer]. This information is not intended to replace advice given to you by your health care   provider. Make sure you discuss any questions you have with your health care provider. ° °

## 2014-10-19 NOTE — MAU Note (Signed)
Pt states here for lower abdominal pain. Has been having BH contractions. No bleeding. Does have odorous discharge. Denies itching/burning

## 2014-10-19 NOTE — MAU Provider Note (Signed)
  History   G3P1011 at 30.2 wks in with c/o contractions and vag discharge for over a week, yellowish in color and has odor.  CSN: [id number]  Arrival date and time: 10/19/14 1528   None     Chief Complaint  Patient presents with  . Contractions   HPI  OB History    Gravida Para Term Preterm AB TAB SAB Ectopic Multiple Living   Past Medical History  Diagnosis Date  . Medical history non-contributory     Past Surgical History  Procedure Laterality Date  . No past surgeries      Family History  Problem Relation Age of Onset  . Asthma Son     Social History  Substance Use Topics  . Smoking status: Former Smoker    Types: Cigarettes  . Smokeless tobacco: Not on file  . Alcohol Use: No    Allergies: No Known Allergies  Prescriptions prior to admission  Medication Sig Dispense Refill Last Dose  . Prenatal Vit-Fe Fumarate-FA (PRENATAL MULTIVITAMIN) TABS tablet Take 1 tablet by mouth daily.   10/19/2014 at Unknown time  . fluconazole (DIFLUCAN) 150 MG tablet Take 1 tablet (150 mg total) by mouth once. (Patient not taking: Reported on 10/19/2014) 2 tablet 3   . nitrofurantoin, macrocrystal-monohydrate, (MACROBID) 100 MG capsule Take 1 capsule (100 mg total) by mouth 2 (two) times daily. (Patient not taking: Reported on 10/19/2014) 10 capsule 0     Review of Systems  Constitutional: Negative.   HENT: Negative.   Eyes: Negative.   Cardiovascular: Negative.   Gastrointestinal: Positive for abdominal pain.  Genitourinary:       Yellowish vag discharge with odor  Musculoskeletal: Negative.   Skin: Negative.   Neurological: Negative.   Endo/Heme/Allergies: Negative.   Psychiatric/Behavioral: Negative.    Physical Exam   Blood pressure 91/57, pulse 86, temperature 97.8 F (36.6 C), temperature source Oral, resp. rate 18, height  (1.549 m), weight 108 lb 8 oz (49.215 kg), last menstrual period 04/21/2014, not currently  breastfeeding.  Physical Exam  Constitutional: She is oriented to person, place, and time. She appears well-developed and well-nourished.  HENT:  Head: Normocephalic.  Neck: Normal range of motion. Neck supple.  Cardiovascular: Normal rate, regular rhythm and normal heart sounds.   Respiratory: Effort normal and breath sounds normal. No respiratory distress.  GI: Soft. There is no tenderness.  Genitourinary: No bleeding in the vagina. Vaginal discharge found.  Musculoskeletal: Normal range of motion. She exhibits no edema.  Neurological: She is alert and oriented to person, place, and time.  Skin: Skin is warm and dry.  Psychiatric: She has a normal mood and affect. Her behavior is normal. Judgment and thought content normal.    MAU Course  Procedures  MDM D/c home pending wet prep  Assessment and Plan  CG, Chla, and wet prep. SVE cl/th/post/high  Wyvonnia Dusky DARLENE 10/19/2014, 5:48 PM

## 2014-10-20 LAB — RPR: RPR Ser Ql: NONREACTIVE

## 2014-10-20 LAB — GC/CHLAMYDIA PROBE AMP (~~LOC~~) NOT AT ARMC
Chlamydia: NEGATIVE
Neisseria Gonorrhea: NEGATIVE

## 2014-11-26 ENCOUNTER — Inpatient Hospital Stay (HOSPITAL_COMMUNITY)
Admission: AD | Admit: 2014-11-26 | Discharge: 2014-11-26 | Disposition: A | Discharge status: Home / Self Care | Payer: Medicaid Other | Source: Ambulatory Visit | Attending: Family Medicine | Admitting: Family Medicine

## 2014-11-26 ENCOUNTER — Encounter (HOSPITAL_COMMUNITY): Payer: Self-pay | Admitting: *Deleted

## 2014-11-26 DIAGNOSIS — N760 Acute vaginitis: Secondary | ICD-10-CM | POA: Diagnosis not present

## 2014-11-26 DIAGNOSIS — O23593 Infection of other part of genital tract in pregnancy, third trimester: Secondary | ICD-10-CM | POA: Diagnosis not present

## 2014-11-26 DIAGNOSIS — W109XXA Fall (on) (from) unspecified stairs and steps, initial encounter: Secondary | ICD-10-CM | POA: Diagnosis not present

## 2014-11-26 DIAGNOSIS — Z3A35 35 weeks gestation of pregnancy: Secondary | ICD-10-CM | POA: Diagnosis not present

## 2014-11-26 DIAGNOSIS — Z87891 Personal history of nicotine dependence: Secondary | ICD-10-CM | POA: Insufficient documentation

## 2014-11-26 DIAGNOSIS — Y929 Unspecified place or not applicable: Secondary | ICD-10-CM | POA: Insufficient documentation

## 2014-11-26 DIAGNOSIS — S39012A Strain of muscle, fascia and tendon of lower back, initial encounter: Secondary | ICD-10-CM | POA: Diagnosis not present

## 2014-11-26 DIAGNOSIS — O26893 Other specified pregnancy related conditions, third trimester: Secondary | ICD-10-CM | POA: Diagnosis not present

## 2014-11-26 DIAGNOSIS — A499 Bacterial infection, unspecified: Secondary | ICD-10-CM

## 2014-11-26 DIAGNOSIS — B9689 Other specified bacterial agents as the cause of diseases classified elsewhere: Secondary | ICD-10-CM

## 2014-11-26 HISTORY — DX: Urinary tract infection, site not specified: N39.0

## 2014-11-26 LAB — URINALYSIS, ROUTINE W REFLEX MICROSCOPIC
BILIRUBIN URINE: NEGATIVE
Glucose, UA: NEGATIVE mg/dL
KETONES UR: 40 mg/dL — AB
NITRITE: NEGATIVE
PH: 6.5 (ref 5.0–8.0)
Protein, ur: NEGATIVE mg/dL
SPECIFIC GRAVITY, URINE: 1.025 (ref 1.005–1.030)
UROBILINOGEN UA: 2 mg/dL — AB (ref 0.0–1.0)

## 2014-11-26 LAB — URINE MICROSCOPIC-ADD ON

## 2014-11-26 LAB — WET PREP, GENITAL: Trich, Wet Prep: NONE SEEN

## 2014-11-26 MED ORDER — METRONIDAZOLE 500 MG PO TABS: 500.0000 mg | ORAL_TABLET | Freq: Two times a day (BID) | ORAL | Status: DC

## 2014-11-26 MED ORDER — CYCLOBENZAPRINE HCL 5 MG PO TABS: 5.0000 mg | ORAL_TABLET | Freq: Three times a day (TID) | ORAL | Status: DC | PRN

## 2014-11-26 MED ORDER — CYCLOBENZAPRINE HCL 10 MG PO TABS
10.0000 mg | ORAL_TABLET | Freq: Once | ORAL | Status: AC
  Administered 2014-11-26: 10 mg via ORAL
  Filled 2014-11-26: qty 1

## 2014-11-26 NOTE — MAU Provider Note (Signed)
  History     CSN: [id number]  Arrival date and time: 11/26/14 1430   None     Chief Complaint  Patient presents with  . Back Pain   HPI Maria Sanders 19 y.o. [id number] 3528w5d presents to the MAU complaining that she has a lower back ache after she fell on het butt yesterday. She is also complaining of a vaginal discharge for 2 days. Deneis vaginal bleeding. LOF, contractions. Reports positive fetal movement.  Past Medical History  Diagnosis Date  . Medical history non-contributory   . UTI (urinary tract infection)     Past Surgical History  Procedure Laterality Date  . No past surgeries      Family History  Problem Relation Age of Onset  . Asthma Son     Social History  Substance Use Topics  . Smoking status: Former Smoker    Types: Cigarettes  . Smokeless tobacco: None  . Alcohol Use: No    Allergies: No Known Allergies  Prescriptions prior to admission  Medication Sig Dispense Refill Last Dose  . Prenatal Vit-Fe Fumarate-FA (PRENATAL MULTIVITAMIN) TABS tablet Take 1 tablet by mouth daily.   11/26/2014 at Unknown time    Review of Systems  Constitutional: Negative for fever.  Genitourinary:       Vaginal discharge  Musculoskeletal: Positive for back pain.  All other systems reviewed and are negative.  Physical Exam   Blood pressure 108/63, pulse 92, temperature 98.4 F (36.9 C), temperature source Oral, resp. rate 16, last menstrual period 04/21/2014.  Physical Exam  Nursing note and vitals reviewed. Constitutional: She is oriented to person, place, and time. She appears well-developed and well-nourished. No distress.  HENT:  Head: Normocephalic and atraumatic.  Cardiovascular: Normal rate and normal heart sounds.   Respiratory: Effort normal. No respiratory distress.  GI: Soft. There is no tenderness.  Musculoskeletal: Normal range of motion.  Neurological: She is alert and oriented to person, place, and time.  Skin: Skin is warm and dry.   Psychiatric: She has a normal mood and affect. Her behavior is normal. Judgment and thought content normal.    MAU Course  Procedures  MDM Nst reactive. Cat 1; occasional contractions. Flexeril improved pain . Will treat for BV. Discahrge to home  Assessment and Plan  Back Strain Bacterial Vaginitis RX; Flagyl; Flexeril Discharge to home  [facility] CenterClemmons,Lori Grissett 11/26/2014, 5:41 PM

## 2014-11-26 NOTE — MAU Note (Signed)
States last night around 2330, she was going down steps and missed the last step and fell onto her bottom. States this morning, she started feeling pain in her lower back and some abdominal soreness. States she has had heavy white vaginal D/C with odor for 2 days. No bleeding.

## 2014-11-26 NOTE — Discharge Instructions (Signed)

## 2014-12-02 ENCOUNTER — Inpatient Hospital Stay (HOSPITAL_COMMUNITY)
Admission: AD | Admit: 2014-12-02 | Discharge: 2014-12-02 | Disposition: A | Discharge status: Home / Self Care | Payer: Medicaid Other | Source: Ambulatory Visit | Attending: Family Medicine | Admitting: Family Medicine

## 2014-12-02 ENCOUNTER — Encounter (HOSPITAL_COMMUNITY): Payer: Self-pay | Admitting: *Deleted

## 2014-12-02 DIAGNOSIS — O26893 Other specified pregnancy related conditions, third trimester: Secondary | ICD-10-CM | POA: Diagnosis not present

## 2014-12-02 DIAGNOSIS — Z3A36 36 weeks gestation of pregnancy: Secondary | ICD-10-CM | POA: Insufficient documentation

## 2014-12-02 DIAGNOSIS — R1030 Lower abdominal pain, unspecified: Secondary | ICD-10-CM | POA: Diagnosis present

## 2014-12-02 DIAGNOSIS — Z87891 Personal history of nicotine dependence: Secondary | ICD-10-CM | POA: Insufficient documentation

## 2014-12-02 MED ORDER — OXYCODONE-ACETAMINOPHEN 5-325 MG PO TABS: 1.0000 | ORAL_TABLET | ORAL | Status: DC | PRN

## 2014-12-02 NOTE — Discharge Instructions (Signed)
Ms. Maria Sanders, the pain you are having likely is due to your baby moving lower in your abdomen as your pregnancy progresses. Your fall could have contributed to your baby moving lower, as well. The pain in your bottom is likely due to the bruising you had after your fall. Your vital signs look good today, and your baby looks healthy on monitor.   I recommend warm compresses for 15 minutes as needed and warm baths to help with pain. Try tylenol. If this does not help, I have prescribed a few tablets of percocet.  If you have loss of fluid, vaginal bleeding, or regular contractions, please seek medical care.

## 2014-12-02 NOTE — MAU Provider Note (Signed)
History   CSN: [id number]  Arrival date and time: 12/02/14 0012   None    Chief Complaint  Patient presents with  . Abdominal Pain   HPI Ms. Pollick is a 19 yo G3P1011 at [redacted]w[redacted]d who presents with lower abdominal pain. Pain began this morning. It is fairly constant but is worsened by walking. The pain is sometimes sharp. She also feels pain in her bottom when she moves her legs. Of note, she was seen 11/26/14 after falling down the stairs. At that time she was prescribed flexeril and flagyl for few clue cells seen on wet prep. She states the flexeril does not take her pain away but dose make her sleepy. She has not taken anything else for pain. She denies LOF, VB, or regular contractions. She endorses frequent fetal movement. She also denies having trouble sleeping. She reports having had sexual intercourse 2 days ago. She has been taking walks twice daily. She has had Braxton Hicks contractions for the last 4  Weeks.   OB History    Gravida Para Term Preterm AB TAB SAB Ectopic Multiple Living   Past Medical History  Diagnosis Date  . Medical history non-contributory   . UTI (urinary tract infection)     Past Surgical History  Procedure Laterality Date  . No past surgeries      Family History  Problem Relation Age of Onset  . Asthma Son     Social History  Substance Use Topics  . Smoking status: Former Smoker    Types: Cigarettes  . Smokeless tobacco: None  . Alcohol Use: No    Allergies: No Known Allergies  No prescriptions prior to admission    Review of Systems  Constitutional: Negative for fever and chills.  Eyes: Negative for blurred vision.  Cardiovascular: Negative for chest pain.  Gastrointestinal: Positive for abdominal pain. Negative for nausea, vomiting and diarrhea.  Genitourinary: Negative for dysuria.  Musculoskeletal: Positive for falls (tripped down stairs 11/26/14).  Neurological: Negative for dizziness and headaches.    Physical Exam   Blood pressure 102/59, pulse 82, temperature 97.7 F (36.5 C), temperature source Oral, resp. rate 20, height  (1.549 m), weight 52.39 kg (115 lb 8 oz), last menstrual period 04/21/2014.  Physical Exam  Constitutional: She is oriented to person, place, and time. She appears well-developed and well-nourished. No distress.  HENT:  Head: Normocephalic and atraumatic.  Cardiovascular: Normal rate, regular rhythm, normal heart sounds and intact distal pulses.  Exam reveals no gallop and no friction rub.   No murmur heard. Respiratory: Effort normal and breath sounds normal. No respiratory distress.  GI: She exhibits mass (gravid, fetal head low in pelvis). There is no tenderness.  Musculoskeletal: Normal range of motion. She exhibits no edema (but with LE swelling bilaterally).  Neurological: She is alert and oriented to person, place, and time.  Skin: Skin is warm and dry.  Patient does have ecchymoses of shins bilaterally and 2x2 cm ecchymosis on left buttocks.   MAU Course  Procedures  MDM NST  125/mod/+accels, no decels Toco: Quiet Patient observed on toco and FHT for approximately 1 hour.   Assessment and Plan  Patient presents at [redacted]w[redacted]d with lower abdominal pain x1 day. She was not having contractions on toco, and FHT was reactive. Suspect pain is secondary to baby moving lower in abdomen with progression of pregnancy, possibly exacerbated by recent fall. Pain in  buttocks likely 2/2 to bruising after fall.   -Discharged patient home. -Recommended warm compresses/baths and tylenol for pain. Provided prescription for percocet (4 tablets) if these interventions do not work. -Provided labor return precautions. -Has [facility]-up [facility] next week.  Jamelle HaringHillary M Fitzgerald, MD [facility], PGY-1 12/02/2014, 1:49 AM   OB fellow attestation: I have seen and examined this patient; I agree with above documentation in the resident's note.    Gunnar BullaRamekquia I Schuff is a 19 y.o. G3P1011 reporting lower abdominal pain +FM, denies LOF, VB, contractions, vaginal discharge.  PE: BP 102/59 mmHg  Pulse 82  Temp(Src) 97.7 F (36.5 C) (Oral)  Resp 20  Ht 5\' 1"  (1.549 m)  Wt 115 lb 8 oz (52.39 kg)  BMI 21.83 kg/m2  LMP 04/21/2014 (Approximate) Gen: calm comfortable, NAD Resp: normal effort, no distress Abd: gravid. Mild TTP over pubic symphysis  ROS, labs, PMH reviewed NST reactive   Plan: - fetal kick counts reinforced, labor precautions - continue routine follow up in OB clinic - Agree with above management. Rx for #4 percocet prescribed  Federico FlakeKimberly Niles Taraya Steward, MD 2:34 AM

## 2014-12-02 NOTE — MAU Note (Signed)
PT SAYS   SHE WAS HERE ON Friday-  SHE HAD FALLEN  DOWN  STEPS-  WE  GAVE   MEDS -   AND HOME.     SAYS SHE IS  SORE  AND PUBIC AREA  HURTS  WHEN SHE WALKS -  WHEN SHE   SITS  ALL OK.    PNC-  HD.  DENIES HSV AND MRSA.

## 2014-12-19 ENCOUNTER — Inpatient Hospital Stay (HOSPITAL_COMMUNITY)
Admission: AD | Admit: 2014-12-19 | Discharge: 2014-12-19 | Disposition: A | Discharge status: Home / Self Care | Payer: Medicaid Other | Source: Ambulatory Visit | Attending: Obstetrics & Gynecology | Admitting: Obstetrics & Gynecology

## 2014-12-19 ENCOUNTER — Encounter (HOSPITAL_COMMUNITY): Payer: Self-pay | Admitting: *Deleted

## 2014-12-19 DIAGNOSIS — N898 Other specified noninflammatory disorders of vagina: Secondary | ICD-10-CM | POA: Diagnosis present

## 2014-12-19 DIAGNOSIS — Z3A39 39 weeks gestation of pregnancy: Secondary | ICD-10-CM | POA: Diagnosis not present

## 2014-12-19 DIAGNOSIS — Z87891 Personal history of nicotine dependence: Secondary | ICD-10-CM | POA: Insufficient documentation

## 2014-12-19 DIAGNOSIS — B373 Candidiasis of vulva and vagina: Secondary | ICD-10-CM | POA: Diagnosis not present

## 2014-12-19 DIAGNOSIS — O98813 Other maternal infectious and parasitic diseases complicating pregnancy, third trimester: Secondary | ICD-10-CM | POA: Diagnosis not present

## 2014-12-19 DIAGNOSIS — O99820 Streptococcus B carrier state complicating pregnancy: Secondary | ICD-10-CM | POA: Diagnosis not present

## 2014-12-19 DIAGNOSIS — O26893 Other specified pregnancy related conditions, third trimester: Secondary | ICD-10-CM

## 2014-12-19 LAB — URINALYSIS, ROUTINE W REFLEX MICROSCOPIC
Bilirubin Urine: NEGATIVE
GLUCOSE, UA: NEGATIVE mg/dL
KETONES UR: NEGATIVE mg/dL
Nitrite: NEGATIVE
PROTEIN: NEGATIVE mg/dL
Specific Gravity, Urine: 1.015 (ref 1.005–1.030)
Urobilinogen, UA: 1 mg/dL (ref 0.0–1.0)
pH: 7 (ref 5.0–8.0)

## 2014-12-19 LAB — URINE MICROSCOPIC-ADD ON

## 2014-12-19 LAB — WET PREP, GENITAL
CLUE CELLS WET PREP: NONE SEEN
TRICH WET PREP: NONE SEEN

## 2014-12-19 LAB — POCT FERN TEST: POCT FERN TEST: NEGATIVE

## 2014-12-19 MED ORDER — MICONAZOLE NITRATE 100 MG VA SUPP: 100.0000 mg | Freq: Every day | VAGINAL | Status: DC

## 2014-12-19 NOTE — MAU Note (Addendum)
Pt states she woke up having uc's around 0100, noted clear fluid in her underwear.  States her discharge was "runny" yesterday.  Still leaking now.  Denies bleeding.  Has not felt uc's this morning.

## 2014-12-19 NOTE — MAU Provider Note (Signed)
History   CSN: [id number]  Arrival date and time: 12/19/14 [id number]   First Provider Initiated Contact with Patient 12/19/14 1025      Chief Complaint  Patient presents with  . Vaginal Discharge   HPI Ms. Maria Sanders is a 19 yo G3P1011 who presents with concern for PROM. She is GBS positive. She woke up from contractions and noticed clear fluid in her underwear last night around 0100. She also had runny discharge yesterday. Contractions have no decreased in frequency. Patient has little discomfort upon presentation to MAU. She has difficulty estimating how often she is now having contractions but thinks about every few minutes. She also thinks she may have recently lost her mucus blood.  Per health department records, patient had an E. coli UTI in July but never picked up macrobid prescribed. She was found to still have a UTI in September, and she completed a course of amoxicillin. She denies dysuria, increased frequency or n/v/d.   +FM, no vaginal bleeding. She has not had sex for at least a month.   OB History    Gravida Para Term Preterm AB TAB SAB Ectopic Multiple Living   3 1 1  1  1   1       Past Medical History  Diagnosis Date  . Medical history non-contributory   . UTI (urinary tract infection)     Past Surgical History  Procedure Laterality Date  . No past surgeries      Family History  Problem Relation Age of Onset  . Asthma Son     Social History  Substance Use Topics  . Smoking status: Former Smoker    Types: Cigarettes  . Smokeless tobacco: None  . Alcohol Use: No    Allergies: No Known Allergies  No prescriptions prior to admission    Review of Systems  Constitutional: Negative for fever and chills.  Respiratory: Negative for cough.   Cardiovascular: Negative for chest pain.  Gastrointestinal: Negative for nausea, vomiting, abdominal pain and diarrhea.  Genitourinary: Negative for dysuria, urgency and frequency.  Neurological: Negative for dizziness  and headaches.   Physical Exam   Blood pressure 107/77, pulse 86, temperature 97.5 F (36.4 C), temperature source Oral, resp. rate 18, last menstrual period 04/21/2014.  Physical Exam  Constitutional: She is oriented to person, place, and time. She appears well-developed and well-nourished. No distress.  HENT:  Head: Normocephalic and atraumatic.  Cardiovascular: Normal rate, regular rhythm, normal heart sounds and intact distal pulses.  Exam reveals no gallop and no friction rub.   No murmur heard. Respiratory: Effort normal and breath sounds normal. No respiratory distress. She has no wheezes.  GI: She exhibits mass (gravid). There is no tenderness.  Genitourinary: Vaginal discharge found.  GU: No pooling on speculum exam. Copious amount of white, curd-like discharge with slightly greenish tint. Cervix posterior, thick and closed.  Musculoskeletal: She exhibits no edema or tenderness.  Neurological: She is alert and oriented to person, place, and time.  Skin: Skin is warm and dry.  Psychiatric: She has a normal mood and affect. Her behavior is normal. Thought content normal.    MAU Course  Procedures  MDM  Monitored with toco and FHR. Irregular contractions seen. Reactive FHT. Performed speculum exam. No pooling seen. Fern test negative. Obtained wet prep given thick consistency of discharge. Many yeast seen. Obtained UA given recent UTI and chief complaint of LOF. Positive for large leukocytes. Few bacteria seen on microscopic.   Assessment and Plan  Patient  presented with concern for PROM. Exam was negative for pooling. Fern test was negative. Loss of fluid most likely increased discharge 2/2 yeast infection. Patient not having SOL, as contractions are mild and irregular; cervix very posterior, thick, and closed. NST reactive.  -Discharge patient home with labor return precautions. -Prescribed monistat suppositories for yeast infection. -Ordered urine culture. Patient will  be contacted to start antibiotic if bacteria grows as is suggestive of possible asymptomatic infection; patient currently suppressed with macrobid  Jamelle Haring 12/19/2014, 12:44 PM    OB FELLOW MAU DISCHARGE ATTESTATION  I have seen and examined this patient; I agree with above documentation in the resident's note.    Silvano Bilis, MD 1:49 PM

## 2014-12-19 NOTE — Discharge Instructions (Signed)
Ms. Maria Sanders, today we checked to see if your water had broken. Those tests were negative. However, a sample of your vaginal discharge was positive for yeast infection, which can cause increased discharge. I have prescribed monistat to treat the yeast infection.  Your urine had some infection fighting cells present, so we sent your sample for culture to see if any bacteria grows. If you need treatment, you will get a call.

## 2014-12-21 LAB — URINE CULTURE

## 2014-12-26 ENCOUNTER — Inpatient Hospital Stay (HOSPITAL_COMMUNITY)
Admission: AD | Admit: 2014-12-26 | Discharge: 2014-12-27 | DRG: 775 | Disposition: A | Discharge status: Home / Self Care | Payer: Medicaid Other | Source: Ambulatory Visit | Attending: Obstetrics & Gynecology | Admitting: Obstetrics & Gynecology

## 2014-12-26 ENCOUNTER — Encounter (HOSPITAL_COMMUNITY): Payer: Self-pay | Admitting: *Deleted

## 2014-12-26 DIAGNOSIS — O480 Post-term pregnancy: Secondary | ICD-10-CM | POA: Diagnosis present

## 2014-12-26 DIAGNOSIS — Z87891 Personal history of nicotine dependence: Secondary | ICD-10-CM

## 2014-12-26 DIAGNOSIS — O471 False labor at or after 37 completed weeks of gestation: Secondary | ICD-10-CM

## 2014-12-26 DIAGNOSIS — Z825 Family history of asthma and other chronic lower respiratory diseases: Secondary | ICD-10-CM | POA: Diagnosis not present

## 2014-12-26 DIAGNOSIS — O99820 Streptococcus B carrier state complicating pregnancy: Secondary | ICD-10-CM | POA: Diagnosis present

## 2014-12-26 DIAGNOSIS — IMO0001 Reserved for inherently not codable concepts without codable children: Secondary | ICD-10-CM

## 2014-12-26 DIAGNOSIS — Z3A40 40 weeks gestation of pregnancy: Secondary | ICD-10-CM

## 2014-12-26 DIAGNOSIS — B019 Varicella without complication: Secondary | ICD-10-CM | POA: Diagnosis present

## 2014-12-26 DIAGNOSIS — O99824 Streptococcus B carrier state complicating childbirth: Secondary | ICD-10-CM | POA: Diagnosis present

## 2014-12-26 HISTORY — DX: Unspecified infectious disease: B99.9

## 2014-12-26 LAB — BASIC METABOLIC PANEL
Anion gap: 8 (ref 5–15)
BUN: 7 mg/dL (ref 6–20)
CALCIUM: 9.5 mg/dL (ref 8.9–10.3)
CO2: 23 mmol/L (ref 22–32)
CREATININE: 0.61 mg/dL (ref 0.44–1.00)
Chloride: 106 mmol/L (ref 101–111)
GFR calc Af Amer: >60 mL/min (ref >60)
GLUCOSE: 78 mg/dL (ref 65–99)
Potassium: 4.7 mmol/L (ref 3.5–5.1)
SODIUM: 137 mmol/L (ref 135–145)

## 2014-12-26 LAB — CBC
HEMATOCRIT: 34.1 % — AB (ref 36.0–46.0)
Hemoglobin: 11 g/dL — ABNORMAL LOW (ref 12.0–15.0)
MCH: 30.6 pg (ref 26.0–34.0)
MCHC: 32.3 g/dL (ref 30.0–36.0)
MCV: 94.7 fL (ref 78.0–100.0)
PLATELETS: 274 10*3/uL (ref 150–400)
RBC: 3.6 MIL/uL — AB (ref 3.87–5.11)
RDW: 13.8 % (ref 11.5–15.5)
WBC: 8.8 10*3/uL (ref 4.0–10.5)

## 2014-12-26 LAB — TYPE AND SCREEN
ABO/RH(D): O POS
ANTIBODY SCREEN: NEGATIVE

## 2014-12-26 MED ORDER — LACTATED RINGERS IV SOLN: 500.0000 mL | INTRAVENOUS | Status: DC | PRN

## 2014-12-26 MED ORDER — PENICILLIN G POTASSIUM 5000000 UNITS IJ SOLR
5.0000 10*6.[IU] | Freq: Once | INTRAVENOUS | Status: DC
  Filled 2014-12-26: qty 5

## 2014-12-26 MED ORDER — FLEET ENEMA 7-19 GM/118ML RE ENEM: 1.0000 | ENEMA | Freq: Every day | RECTAL | Status: DC | PRN

## 2014-12-26 MED ORDER — ONDANSETRON HCL 4 MG/2ML IJ SOLN: 4.0000 mg | Freq: Four times a day (QID) | INTRAMUSCULAR | Status: DC | PRN

## 2014-12-26 MED ORDER — OXYCODONE-ACETAMINOPHEN 5-325 MG PO TABS: 2.0000 | ORAL_TABLET | ORAL | Status: DC | PRN

## 2014-12-26 MED ORDER — OXYTOCIN BOLUS FROM INFUSION: 500.0000 mL | INTRAVENOUS | Status: DC

## 2014-12-26 MED ORDER — PHENYLEPHRINE 40 MCG/ML (10ML) SYRINGE FOR IV PUSH (FOR BLOOD PRESSURE SUPPORT): 80.0000 ug | PREFILLED_SYRINGE | INTRAVENOUS | Status: DC | PRN

## 2014-12-26 MED ORDER — DIPHENHYDRAMINE HCL 50 MG/ML IJ SOLN: 12.5000 mg | INTRAMUSCULAR | Status: DC | PRN

## 2014-12-26 MED ORDER — FENTANYL 2.5 MCG/ML BUPIVACAINE 1/10 % EPIDURAL INFUSION (WH - ANES): 14.0000 mL/h | INTRAMUSCULAR | Status: DC | PRN

## 2014-12-26 MED ORDER — OXYTOCIN 40 UNITS IN LACTATED RINGERS INFUSION - SIMPLE MED: 62.5000 mL/h | INTRAVENOUS | Status: DC

## 2014-12-26 MED ORDER — CITRIC ACID-SODIUM CITRATE 334-500 MG/5ML PO SOLN: 30.0000 mL | ORAL | Status: DC | PRN

## 2014-12-26 MED ORDER — OXYCODONE-ACETAMINOPHEN 5-325 MG PO TABS: 1.0000 | ORAL_TABLET | ORAL | Status: DC | PRN

## 2014-12-26 MED ORDER — ACETAMINOPHEN 325 MG PO TABS: 650.0000 mg | ORAL_TABLET | ORAL | Status: DC | PRN

## 2014-12-26 MED ORDER — LIDOCAINE HCL (PF) 1 % IJ SOLN: 30.0000 mL | INTRAMUSCULAR | Status: DC | PRN

## 2014-12-26 MED ORDER — PENICILLIN G POTASSIUM 5000000 UNITS IJ SOLR
2.5000 10*6.[IU] | INTRAVENOUS | Status: DC
  Filled 2014-12-26 (×4): qty 2.5

## 2014-12-26 MED ORDER — LACTATED RINGERS IV SOLN: INTRAVENOUS | Status: DC

## 2014-12-26 MED ORDER — EPHEDRINE 5 MG/ML INJ: 10.0000 mg | INTRAVENOUS | Status: DC | PRN

## 2014-12-26 NOTE — MAU Note (Signed)
Report called to LeeAnn RN in BS. Pt may come to 164 

## 2014-12-26 NOTE — H&P (Signed)
Maria Sanders is a 19 y.o. female G3P1011 with IUP at 6552w0d presenting for contractions starting this am. Pt states she has been having regular, every 10 minutes contractions, associated with none vaginal bleeding.  Membranes are intact, with active fetal movement.   [facility]  Of note, GBS positive, Varicella non-imm. U/S 43%tile at [redacted]W[redacted]D.   Prenatal History/Complications:  Past Medical History: Past Medical History  Diagnosis Date  . Medical history non-contributory   . UTI (urinary tract infection)   . Infection     UTI    Past Surgical History: Past Surgical History  Procedure Laterality Date  . No past surgeries      Obstetrical History: OB History    Gravida Para Term Preterm AB TAB SAB Ectopic Multiple Living   3 1 1  1  1   1       Social History: Social History   Social History  . Marital Status: Single    Spouse Name: N/A  . Number of Children: N/A  . Years of Education: N/A   Social History Main Topics  . Smoking status: Former Smoker    Types: Cigarettes  . Smokeless tobacco: Never Used     Comment: quit in first trimester  . Alcohol Use: No  . Drug Use: No  . Sexual Activity: Yes    Birth Control/ Protection: None     Comment: last intercorse Sep 29, 2013   Other Topics Concern  . None   Social History Narrative    Family History: Family History  Problem Relation Age of Onset  . Asthma Son     Allergies: No Known Allergies  Prescriptions prior to admission  Medication Sig Dispense Refill Last Dose  . Prenatal Vit-Fe Fumarate-FA (PRENATAL MULTIVITAMIN) TABS tablet Take 1 tablet by mouth daily.   12/26/2014 at Unknown time  . miconazole (MICOTIN) 100 MG vaginal suppository Place 1 suppository (100 mg total) vaginally at bedtime. (Patient not taking: Reported on 12/26/2014) 7 suppository 0      Review of Systems   Constitutional: Negative.   Blood pressure 113/75, pulse 78, temperature 98.6 F (37 C),  temperature source Oral, resp. rate 18, last menstrual period 04/21/2014. General appearance: alert, cooperative and no distress Lungs: clear to auscultation bilaterally Heart: regular rate and rhythm Abdomen: soft, non-tender; bowel sounds normal Pelvic: 4CM dilated, 40% effaced, anterior cervix. -2 station.  Extremities: Homans sign is negative, no sign of DVT Presentation: cephalic Fetal monitoringBaseline: 130 bpm Uterine activityDate/time of onset: 11/18 0600am.  Dilation: 4 Effacement (%): 60 Station: -2 Exam by:: Orinda KennerKimberly Whitehurst-Boyd RN   Prenatal labs: ABO, Rh:   O pos.  Antibody:   negative Rubella: Immune RPR: Non Reactive (09/11 1728)  HBsAg:   Neg.  HIV: Non Reactive (05/10 1503)  GBS:   Positive 1 hr Glucola 125 Genetic screening  : Normal Anatomy US : Normal.    Prenatal Transfer Tool  Maternal Diabetes: No Genetic Screening: Normal Maternal Ultrasounds/Referrals: Normal Fetal Ultrasounds or other Referrals:  None Maternal Substance Abuse:  No Significant Maternal Medications:  None Significant Maternal Lab Results: Lab values include: Group B Strep positive, Other: Varicella Non-Immune.      No results found for this or any previous visit (from the past 24 hour(s)).  Assessment: Maria BullaRamekquia I Taras is a 19 y.o. G3P1011 at 5152w0d by 21W U/S, GBS positive, here for early active labor.  #Labor: Plan for admission with expectant SVD. Augmentation as needed.  #Pain:  IV pain medicine. May have epidural if needed.  #FWB:  Cat I  #ID:  GBS positive. Prophylaxis ordered.   #MOF: Breast  #MOC: Depo #Circ:  Desired (outpatient).   Caleb Melancon 12/26/2014, 9:59 PM   CNM attestation:  I have seen and examined this patient; I agree with above documentation in the resident's note.   Maria Sanders is a 19 y.o. G3P1011 here for early labor  PE: BP 125/81 mmHg  Pulse 78  Temp(Src) 97.8 F (36.6 C) (Oral)  Resp 18  Ht  (1.549 m)  Wt  53.524 kg (118 lb)  BMI 22.31 kg/m2  LMP 04/21/2014 (Approximate) Gen: calm comfortable, NAD Resp: normal effort, no distress Abd: gravid  ROS, labs, PMH reviewed  Plan: Admit to [facility] Expectant management Anticipate SVD   Cam Hai  CNM  12/27/2014, 12:51 AM

## 2014-12-26 NOTE — MAU Note (Addendum)
Was having a lot of diarrhea this morning. Has been contracting today, come and goes. Back is hurting now when she contracts. Was 1 cm on Monday.

## 2014-12-27 ENCOUNTER — Inpatient Hospital Stay (HOSPITAL_COMMUNITY): Payer: Medicaid Other | Admitting: Anesthesiology

## 2014-12-27 ENCOUNTER — Inpatient Hospital Stay (HOSPITAL_COMMUNITY)
Admission: AD | Admit: 2014-12-27 | Discharge: 2014-12-29 | Disposition: A | Discharge status: Home / Self Care | Payer: Medicaid Other | Source: Ambulatory Visit | Attending: Obstetrics and Gynecology | Admitting: Obstetrics and Gynecology

## 2014-12-27 ENCOUNTER — Encounter (HOSPITAL_COMMUNITY): Payer: Self-pay | Admitting: *Deleted

## 2014-12-27 DIAGNOSIS — Z2839 Other underimmunization status: Secondary | ICD-10-CM | POA: Diagnosis present

## 2014-12-27 DIAGNOSIS — Z3A40 40 weeks gestation of pregnancy: Secondary | ICD-10-CM

## 2014-12-27 DIAGNOSIS — B019 Varicella without complication: Secondary | ICD-10-CM | POA: Diagnosis present

## 2014-12-27 DIAGNOSIS — O99824 Streptococcus B carrier state complicating childbirth: Secondary | ICD-10-CM

## 2014-12-27 DIAGNOSIS — O99820 Streptococcus B carrier state complicating pregnancy: Secondary | ICD-10-CM

## 2014-12-27 DIAGNOSIS — O480 Post-term pregnancy: Secondary | ICD-10-CM

## 2014-12-27 DIAGNOSIS — O09899 Supervision of other high risk pregnancies, unspecified trimester: Secondary | ICD-10-CM | POA: Diagnosis present

## 2014-12-27 DIAGNOSIS — Z825 Family history of asthma and other chronic lower respiratory diseases: Secondary | ICD-10-CM

## 2014-12-27 DIAGNOSIS — Z87891 Personal history of nicotine dependence: Secondary | ICD-10-CM

## 2014-12-27 DIAGNOSIS — O9852 Other viral diseases complicating childbirth: Secondary | ICD-10-CM | POA: Diagnosis present

## 2014-12-27 DIAGNOSIS — IMO0001 Reserved for inherently not codable concepts without codable children: Secondary | ICD-10-CM

## 2014-12-27 DIAGNOSIS — Z283 Underimmunization status: Secondary | ICD-10-CM

## 2014-12-27 LAB — CBC
HEMATOCRIT: 35.8 % — AB (ref 36.0–46.0)
HEMOGLOBIN: 11.8 g/dL — AB (ref 12.0–15.0)
MCH: 30.8 pg (ref 26.0–34.0)
MCHC: 33 g/dL (ref 30.0–36.0)
MCV: 93.5 fL (ref 78.0–100.0)
PLATELETS: 307 10*3/uL (ref 150–400)
RBC: 3.83 MIL/uL — AB (ref 3.87–5.11)
RDW: 14.1 % (ref 11.5–15.5)
WBC: 9.6 10*3/uL (ref 4.0–10.5)

## 2014-12-27 LAB — TYPE AND SCREEN
ABO/RH(D): O POS
ANTIBODY SCREEN: NEGATIVE

## 2014-12-27 LAB — RPR: RPR: NONREACTIVE

## 2014-12-27 MED ORDER — OXYCODONE-ACETAMINOPHEN 5-325 MG PO TABS
1.0000 | ORAL_TABLET | ORAL | Status: DC | PRN
  Administered 2014-12-28: 1 via ORAL
  Filled 2014-12-27: qty 1

## 2014-12-27 MED ORDER — FENTANYL CITRATE (PF) 100 MCG/2ML IJ SOLN
100.0000 ug | INTRAMUSCULAR | Status: DC | PRN
  Administered 2014-12-27: 100 ug via INTRAVENOUS
  Filled 2014-12-27: qty 2

## 2014-12-27 MED ORDER — ACETAMINOPHEN 325 MG PO TABS
650.0000 mg | ORAL_TABLET | ORAL | Status: DC | PRN
  Filled 2014-12-27: qty 2

## 2014-12-27 MED ORDER — DIPHENHYDRAMINE HCL 50 MG/ML IJ SOLN: 12.5000 mg | INTRAMUSCULAR | Status: DC | PRN

## 2014-12-27 MED ORDER — PHENYLEPHRINE 40 MCG/ML (10ML) SYRINGE FOR IV PUSH (FOR BLOOD PRESSURE SUPPORT)
PREFILLED_SYRINGE | INTRAVENOUS | Status: AC
  Filled 2014-12-27: qty 20

## 2014-12-27 MED ORDER — LIDOCAINE HCL (PF) 1 % IJ SOLN
INTRAMUSCULAR | Status: DC | PRN
  Administered 2014-12-27: 4 mL via EPIDURAL
  Administered 2014-12-27: 2 mL via EPIDURAL
  Administered 2014-12-27: 4 mL via EPIDURAL

## 2014-12-27 MED ORDER — PENICILLIN G POTASSIUM 5000000 UNITS IJ SOLR
2.5000 10*6.[IU] | INTRAVENOUS | Status: DC
  Filled 2014-12-27 (×5): qty 2.5

## 2014-12-27 MED ORDER — FLEET ENEMA 7-19 GM/118ML RE ENEM: 1.0000 | ENEMA | RECTAL | Status: DC | PRN

## 2014-12-27 MED ORDER — LIDOCAINE HCL (PF) 1 % IJ SOLN
30.0000 mL | INTRAMUSCULAR | Status: DC | PRN
  Filled 2014-12-27: qty 30

## 2014-12-27 MED ORDER — LACTATED RINGERS IV SOLN
INTRAVENOUS | Status: DC
  Administered 2014-12-27: 19:00:00 via INTRAVENOUS

## 2014-12-27 MED ORDER — FENTANYL 2.5 MCG/ML BUPIVACAINE 1/10 % EPIDURAL INFUSION (WH - ANES)
INTRAMUSCULAR | Status: AC
  Filled 2014-12-27: qty 125

## 2014-12-27 MED ORDER — ONDANSETRON HCL 4 MG/2ML IJ SOLN: 4.0000 mg | Freq: Four times a day (QID) | INTRAMUSCULAR | Status: DC | PRN

## 2014-12-27 MED ORDER — PHENYLEPHRINE 40 MCG/ML (10ML) SYRINGE FOR IV PUSH (FOR BLOOD PRESSURE SUPPORT)
80.0000 ug | PREFILLED_SYRINGE | INTRAVENOUS | Status: DC | PRN
  Filled 2014-12-27: qty 2

## 2014-12-27 MED ORDER — LACTATED RINGERS IV SOLN
500.0000 mL | INTRAVENOUS | Status: DC | PRN
  Administered 2014-12-27: 500 mL via INTRAVENOUS

## 2014-12-27 MED ORDER — OXYTOCIN 40 UNITS IN LACTATED RINGERS INFUSION - SIMPLE MED
62.5000 mL/h | INTRAVENOUS | Status: DC
  Filled 2014-12-27: qty 1000

## 2014-12-27 MED ORDER — OXYCODONE-ACETAMINOPHEN 5-325 MG PO TABS
2.0000 | ORAL_TABLET | ORAL | Status: DC | PRN
  Filled 2014-12-27: qty 2

## 2014-12-27 MED ORDER — EPHEDRINE 5 MG/ML INJ
10.0000 mg | INTRAVENOUS | Status: DC | PRN
  Filled 2014-12-27: qty 2

## 2014-12-27 MED ORDER — OXYTOCIN BOLUS FROM INFUSION
500.0000 mL | INTRAVENOUS | Status: DC
  Administered 2014-12-27: 500 mL via INTRAVENOUS

## 2014-12-27 MED ORDER — PENICILLIN G POTASSIUM 5000000 UNITS IJ SOLR
5.0000 10*6.[IU] | Freq: Once | INTRAVENOUS | Status: AC
  Administered 2014-12-27: 5 10*6.[IU] via INTRAVENOUS
  Filled 2014-12-27: qty 5

## 2014-12-27 MED ORDER — FENTANYL 2.5 MCG/ML BUPIVACAINE 1/10 % EPIDURAL INFUSION (WH - ANES)
14.0000 mL/h | INTRAMUSCULAR | Status: DC | PRN
  Administered 2014-12-27 (×2): 14 mL/h via EPIDURAL

## 2014-12-27 MED ORDER — CITRIC ACID-SODIUM CITRATE 334-500 MG/5ML PO SOLN: 30.0000 mL | ORAL | Status: DC | PRN

## 2014-12-27 NOTE — Discharge Instructions (Signed)
Braxton Hicks Contractions °Contractions of the uterus can occur throughout pregnancy. Contractions are not always a sign that you are in labor.  °WHAT ARE BRAXTON HICKS CONTRACTIONS?  °Contractions that occur before labor are called Braxton Hicks contractions, or false labor. Toward the end of pregnancy (32-34 weeks), these contractions can develop more often and may become more forceful. This is not true labor because these contractions do not result in opening (dilatation) and thinning of the cervix. They are sometimes difficult to tell apart from true labor because these contractions can be forceful and people have different pain tolerances. You should not feel embarrassed if you go to the hospital with false labor. Sometimes, the only way to tell if you are in true labor is for your health care provider to look for changes in the cervix. °If there are no prenatal problems or other health problems associated with the pregnancy, it is completely safe to be sent home with false labor and await the onset of true labor. °HOW CAN YOU TELL THE DIFFERENCE BETWEEN TRUE AND FALSE LABOR? °False Labor °· The contractions of false labor are usually shorter and not as hard as those of true labor.   °· The contractions are usually irregular.   °· The contractions are often felt in the front of the lower abdomen and in the groin.   °· The contractions may go away when you walk around or change positions while lying down.   °· The contractions get weaker and are shorter lasting as time goes on.   °· The contractions do not usually become progressively stronger, regular, and closer together as with true labor.   °True Labor °· Contractions in true labor last 30-70 seconds, become very regular, usually become more intense, and increase in frequency.   °· The contractions do not go away with walking.   °· The discomfort is usually felt in the top of the uterus and spreads to the lower abdomen and low back.   °· True labor can be  determined by your health care provider with an exam. This will show that the cervix is dilating and getting thinner.   °WHAT TO REMEMBER °· Keep up with your usual exercises and follow other instructions given by your health care provider.   °· Take medicines as directed by your health care provider.   °· Keep your regular prenatal appointments.   °· Eat and drink lightly if you think you are going into labor.   °· If Braxton Hicks contractions are making you uncomfortable:   °¨ Change your position from lying down or resting to walking, or from walking to resting.   °¨ Sit and rest in a tub of warm water.   °¨ Drink 2-3 glasses of water. Dehydration may cause these contractions.   °¨ Do slow and deep breathing several times an hour.   °WHEN SHOULD I SEEK IMMEDIATE MEDICAL CARE? °Seek immediate medical care if: °· Your contractions become stronger, more regular, and closer together.   °· You have fluid leaking or gushing from your vagina.   °· You have a fever.   °· You pass blood-tinged mucus.   °· You have vaginal bleeding.   °· You have continuous abdominal pain.   °· You have low back pain that you never had before.   °· You feel your baby's head pushing down and causing pelvic pressure.   °· Your baby is not moving as much as it used to.   °  °This information is not intended to replace advice given to you by your health care provider. Make sure you discuss any questions you have with your health care   provider. °  °Document Released: 01/24/2005 Document Revised: 01/29/2013 Document Reviewed: 11/05/2012 °Elsevier Interactive Patient Education ©2016 [employer] ° °

## 2014-12-27 NOTE — MAU Provider Note (Signed)
LABOR AND DELIVERY ADMISSION HISTORY AND PHYSICAL NOTE  Gunnar BullaRamekquia I Buday is a 19 y.o. female G3P1011 with IUP at 272w1d by 12-wk u/s presenting for contractions. Admitted last night and discharged a few hours later in latent labor. This PM much stronger contractions, every few minutes. She reports +FMs, No LOF, no VB, no blurry vision, headaches or peripheral edema, and RUQ pain.     Prenatal History/Complications:  Past Medical History: Past Medical History  Diagnosis Date  . Medical history non-contributory   . UTI (urinary tract infection)   . Infection     UTI    Past Surgical History: Past Surgical History  Procedure Laterality Date  . No past surgeries      Obstetrical History: OB History    Gravida Para Term Preterm AB TAB SAB Ectopic Multiple Living   3 1 1  1  1   1       Social History: Social History   Social History  . Marital Status: Single    Spouse Name: N/A  . Number of Children: N/A  . Years of Education: N/A   Social History Main Topics  . Smoking status: Former Smoker    Types: Cigarettes  . Smokeless tobacco: Never Used     Comment: quit in first trimester  . Alcohol Use: No  . Drug Use: No  . Sexual Activity: Yes    Birth Control/ Protection: None     Comment: last intercorse Sep 29, 2013   Other Topics Concern  . None   Social History Narrative    Family History: Family History  Problem Relation Age of Onset  . Asthma Son     Allergies: No Known Allergies  Prescriptions prior to admission  Medication Sig Dispense Refill Last Dose  . Prenatal Vit-Fe Fumarate-FA (PRENATAL MULTIVITAMIN) TABS tablet Take 1 tablet by mouth daily.   12/27/2014 at Unknown time     Review of Systems   All systems reviewed and negative except as stated in HPI  Blood pressure 122/77, pulse 84, temperature 97.5 F (36.4 C), temperature source Oral, resp. rate 20, last menstrual period 04/21/2014. General appearance: alert, cooperative, appears  stated age and moderate distress Lungs: clear to auscultation bilaterally Heart: regular rate and rhythm Abdomen: soft, non-tender; bowel sounds normal Extremities: No calf swelling or tenderness Presentation: cephalic Fetal monitoring: 125/mod;/+a/-d Uterine activity: q 3-4 min  Dilation: 5.5 Effacement (%): 90 Station: -2 Exam by:: Dorrene GermanJ. Lowe RN   Prenatal labs: ABO, Rh: --/--/O POS (11/18 2250) Antibody: NEG (11/18 2250) Rubella: !Error!immune RPR: Non Reactive (11/18 2250)  HBsAg:   neg HIV: Non Reactive (05/10 1503)  GBS:   pos 1 hr Glucola 119  Genetic screening  Quad neg Anatomy US wnl  Prenatal Transfer Tool  Maternal Diabetes: No Genetic Screening: Normal Maternal Ultrasounds/Referrals: Normal Fetal Ultrasounds or other Referrals:  None Maternal Substance Abuse:  No Significant Maternal Medications:  Meds include: Other: macrobid Significant Maternal Lab Results: Lab values include: Group B Strep positive  Results for orders placed or performed during the hospital encounter of 12/26/14 (from the past 24 hour(s))  CBC   Collection Time: 12/26/14 10:50 PM  Result Value Ref Range   WBC 8.8 4.0 - 10.5 K/uL   RBC 3.60 (L) 3.87 - 5.11 MIL/uL   Hemoglobin 11.0 (L) 12.0 - 15.0 g/dL   HCT 08.634.1 (L) 57.836.0 - 46.946.0 %   MCV 94.7 78.0 - 100.0 fL   MCH 30.6 26.0 - 34.0 pg  MCHC 32.3 30.0 - 36.0 g/dL   RDW 16.1 09.6 - 04.5 %   Platelets 274 150 - 400 K/uL  RPR   Collection Time: 12/26/14 10:50 PM  Result Value Ref Range   RPR Ser Ql Non Reactive Non Reactive  Basic metabolic panel   Collection Time: 12/26/14 10:50 PM  Result Value Ref Range   Sodium 137 135 - 145 mmol/L   Potassium 4.7 3.5 - 5.1 mmol/L   Chloride 106 101 - 111 mmol/L   CO2 23 22 - 32 mmol/L   Glucose, Bld 78 65 - 99 mg/dL   BUN 7 6 - 20 mg/dL   Creatinine, Ser 4.09 0.44 - 1.00 mg/dL   Calcium 9.5 8.9 - 81.1 mg/dL   GFR calc non Af Amer >60 >60 mL/min   GFR calc Af Amer >60 >60 mL/min   Anion gap 8  5 - 15  Type and screen [facility]   Collection Time: 12/26/14 10:50 PM  Result Value Ref Range   ABO/RH(D) O POS    Antibody Screen NEG    Sample Expiration 12/29/2014     Patient Active Problem List   Diagnosis Date Noted  . Active labor at term 12/26/2014  . Encounter for fetal anatomic survey   . [redacted] weeks gestation of pregnancy     Assessment: JUDIT AWAD is a 19 y.o. G3P1011 at [redacted]w[redacted]d here for active labor, intact.  #Labor: expectant #Pain: epidural #FWB: Cat 1 #ID:  gbs positive, starting pcn #MOF: breast #MOC: depo #Circ:  No inpatient circ, maybe outpatient  Cherrie Gauze [facility] 12/27/2014, 7:13 PM

## 2014-12-27 NOTE — Discharge Summary (Signed)
Maria Sanders is a 19 y.o. female G3P1011 with IUP at 2466w0d presenting for contractions starting this am. Pt states she has been having regular, every 10 minutes contractions, associated with none vaginal bleeding. Membranes are intact, with active fetal movement.  [facility]   Cx exams in MAU hours apart appeared to indicate cx change (from 3.5 to 4cm), but after being admitted to L&D for 3 hours her cx remained at 3/60/-2/vtx intact. Ctx at that time had essentially spaced out completely, FHR 130s +accels, no decels, Category 1.  IUP@40 .1wks False labor  Plan to d/c home at present to await spontaneous labor/ROM. F/U as scheduled at next visit [facility] on 11/21 or sooner prn  SHAW, [facility] 12/27/2014 12:41 AM

## 2014-12-27 NOTE — Progress Notes (Signed)
RN to bedside, membranes bulging outside of vagina. Dr. Ashok PallWouk called for delivery.

## 2014-12-27 NOTE — MAU Note (Signed)
Pt sent home from L&D last night, states uc's have continued.  Denies bleeding or LOF.

## 2014-12-27 NOTE — Progress Notes (Signed)
Dr. Ashok PallWouk at bedside, pt assisted to lithotomy and prepped for delivery.

## 2014-12-27 NOTE — Anesthesia Procedure Notes (Signed)
Epidural Patient location during procedure: OB  Staffing Anesthesiologist: Jamari Moten Performed by: anesthesiologist   Preanesthetic Checklist Completed: patient identified, site marked, surgical consent, pre-op evaluation, timeout performed, IV checked, risks and benefits discussed and monitors and equipment checked  Epidural Patient position: sitting Prep: site prepped and draped and DuraPrep Patient monitoring: continuous pulse ox and blood pressure Approach: midline Location: L3-L4 Injection technique: LOR air and LOR saline  Needle:  Needle type: Tuohy  Needle gauge: 17 G Needle length: 9 cm and 9 Needle insertion depth: 4.5 cm Catheter type: closed end flexible Catheter size: 19 Gauge Catheter at skin depth: 9 cm Test dose: negative  Assessment Events: blood not aspirated, injection not painful, no injection resistance, negative IV test and no paresthesia  Additional Notes Patient identified. Risks/Benefits/Options discussed with patient including but not limited to bleeding, infection, nerve damage, paralysis, failed block, incomplete pain control, headache, blood pressure changes, nausea, vomiting, reactions to medications, itching and postpartum back pain. Confirmed with bedside nurse the patient's most recent platelet count. Confirmed with patient that they are not currently taking any anticoagulation, have any bleeding history or any family history of bleeding disorders. Patient expressed understanding and wished to proceed. All questions were answered. Sterile technique was used throughout the entire procedure. Please see nursing notes for vital signs. Test dose was given through epidural catheter and negative prior to continuing to dose epidural or start infusion. Warning signs of high block given to the patient including shortness of breath, tingling/numbness in hands, complete motor block, or any concerning symptoms with instructions to call for help. Patient was  given instructions on fall risk and not to get out of bed. All questions and concerns addressed with instructions to call with any issues or inadequate analgesia.  Reason for block:procedure for pain

## 2014-12-27 NOTE — Anesthesia Preprocedure Evaluation (Signed)
Anesthesia Evaluation  Patient identified by MRN, date of birth, ID band Patient awake    Reviewed: Allergy & Precautions, H&P , NPO status , Patient's Chart, lab work & pertinent test results  Airway Mallampati: II  TM Distance: >3 FB Neck ROM: full    Dental no notable dental hx.    Pulmonary neg pulmonary ROS, former smoker,    Pulmonary exam normal breath sounds clear to auscultation       Cardiovascular negative cardio ROS Normal cardiovascular exam Rhythm:regular Rate:Normal     Neuro/Psych negative neurological ROS  negative psych ROS   GI/Hepatic negative GI ROS, Neg liver ROS,   Endo/Other  negative endocrine ROS  Renal/GU negative Renal ROS  negative genitourinary   Musculoskeletal   Abdominal   Peds  Hematology negative hematology ROS (+)   Anesthesia Other Findings Pregnancy - uncomplicated Platelets and allergies reviewed Denies active cardiac or pulmonary symptoms, METS > 4  Denies blood thinning medications, bleeding disorders, hypertension, asthma, supine hypotension syndrome, previous anesthesia difficulties    Reproductive/Obstetrics (+) Pregnancy                             Anesthesia Physical Anesthesia Plan  ASA: II  Anesthesia Plan: Epidural   Post-op Pain Management:    Induction:   Airway Management Planned:   Additional Equipment:   Intra-op Plan:   Post-operative Plan:   Informed Consent: I have reviewed the patients History and Physical, chart, labs and discussed the procedure including the risks, benefits and alternatives for the proposed anesthesia with the patient or authorized representative who has indicated his/her understanding and acceptance.     Plan Discussed with: Anesthesiologist and CRNA  Anesthesia Plan Comments: (Patient in extreme pain and dilated to >8cm, contracting frequently)        Anesthesia Quick Evaluation

## 2014-12-28 ENCOUNTER — Encounter (HOSPITAL_COMMUNITY): Payer: Self-pay | Admitting: Student

## 2014-12-28 LAB — CBC
HCT: 30.8 % — ABNORMAL LOW (ref 36.0–46.0)
Hemoglobin: 10.2 g/dL — ABNORMAL LOW (ref 12.0–15.0)
MCH: 30.6 pg (ref 26.0–34.0)
MCHC: 33.1 g/dL (ref 30.0–36.0)
MCV: 92.5 fL (ref 78.0–100.0)
PLATELETS: 264 10*3/uL (ref 150–400)
RBC: 3.33 MIL/uL — AB (ref 3.87–5.11)
RDW: 13.9 % (ref 11.5–15.5)
WBC: 14.1 10*3/uL — ABNORMAL HIGH (ref 4.0–10.5)

## 2014-12-28 LAB — RPR: RPR: NONREACTIVE

## 2014-12-28 MED ORDER — TETANUS-DIPHTH-ACELL PERTUSSIS 5-2.5-18.5 LF-MCG/0.5 IM SUSP
0.5000 mL | Freq: Once | INTRAMUSCULAR | Status: AC
  Administered 2014-12-29: 0.5 mL via INTRAMUSCULAR
  Filled 2014-12-28: qty 0.5

## 2014-12-28 MED ORDER — IBUPROFEN 600 MG PO TABS
600.0000 mg | ORAL_TABLET | Freq: Four times a day (QID) | ORAL | Status: DC
  Administered 2014-12-28 – 2014-12-29 (×7): 600 mg via ORAL
  Filled 2014-12-28 (×6): qty 1

## 2014-12-28 MED ORDER — PRENATAL MULTIVITAMIN CH
1.0000 | ORAL_TABLET | Freq: Every day | ORAL | Status: DC
  Administered 2014-12-28 – 2014-12-29 (×2): 1 via ORAL
  Filled 2014-12-28 (×2): qty 1

## 2014-12-28 MED ORDER — ONDANSETRON HCL 4 MG PO TABS: 4.0000 mg | ORAL_TABLET | ORAL | Status: DC | PRN

## 2014-12-28 MED ORDER — ACETAMINOPHEN 325 MG PO TABS
650.0000 mg | ORAL_TABLET | ORAL | Status: DC | PRN
  Administered 2014-12-28: 650 mg via ORAL

## 2014-12-28 MED ORDER — DIBUCAINE 1 % RE OINT: 1.0000 "application " | TOPICAL_OINTMENT | RECTAL | Status: DC | PRN

## 2014-12-28 MED ORDER — OXYCODONE-ACETAMINOPHEN 5-325 MG PO TABS: 1.0000 | ORAL_TABLET | ORAL | Status: DC | PRN

## 2014-12-28 MED ORDER — ZOLPIDEM TARTRATE 5 MG PO TABS: 5.0000 mg | ORAL_TABLET | Freq: Every evening | ORAL | Status: DC | PRN

## 2014-12-28 MED ORDER — DIPHENHYDRAMINE HCL 25 MG PO CAPS: 25.0000 mg | ORAL_CAPSULE | Freq: Four times a day (QID) | ORAL | Status: DC | PRN

## 2014-12-28 MED ORDER — WITCH HAZEL-GLYCERIN EX PADS: 1.0000 "application " | MEDICATED_PAD | CUTANEOUS | Status: DC | PRN

## 2014-12-28 MED ORDER — OXYCODONE-ACETAMINOPHEN 5-325 MG PO TABS
2.0000 | ORAL_TABLET | ORAL | Status: DC | PRN
  Administered 2014-12-29: 2 via ORAL

## 2014-12-28 MED ORDER — BENZOCAINE-MENTHOL 20-0.5 % EX AERO: 1.0000 "application " | INHALATION_SPRAY | CUTANEOUS | Status: DC | PRN

## 2014-12-28 MED ORDER — SENNOSIDES-DOCUSATE SODIUM 8.6-50 MG PO TABS
2.0000 | ORAL_TABLET | ORAL | Status: DC
  Administered 2014-12-28 – 2014-12-29 (×2): 2 via ORAL
  Filled 2014-12-28 (×2): qty 2

## 2014-12-28 MED ORDER — SIMETHICONE 80 MG PO CHEW: 80.0000 mg | CHEWABLE_TABLET | ORAL | Status: DC | PRN

## 2014-12-28 MED ORDER — ONDANSETRON HCL 4 MG/2ML IJ SOLN: 4.0000 mg | INTRAMUSCULAR | Status: DC | PRN

## 2014-12-28 MED ORDER — LANOLIN HYDROUS EX OINT: TOPICAL_OINTMENT | CUTANEOUS | Status: DC | PRN

## 2014-12-28 NOTE — Progress Notes (Signed)
POSTPARTUM PROGRESS NOTE  Post Partum Day 1 Subjective:  Maria Sanders is a 19 y.o. [id number] 1588w1d s/p nsvd.  No acute events overnight.  Pt denies problems with ambulating, voiding or po intake.  She denies nausea or vomiting.  Pain is well controlled.  She has had flatus. She has not had bowel movement.  Lochia Small.   Objective: Blood pressure 124/83, pulse 73, temperature 98.7 F (37.1 C), temperature source Oral, resp. rate 16, height 5\' 1"  (1.549 m), weight 118 lb (53.524 kg), last menstrual period 04/21/2014, SpO2 98 %, unknown if currently breastfeeding.  Physical Exam:  General: alert, cooperative and no distress Lochia:normal flow Chest: CTAB Heart: RRR no m/r/g Abdomen: +BS, soft, nontender,  Uterine Fundus: firm,  DVT Evaluation: No calf swelling or tenderness Extremities: trace edema   Recent Labs  12/27/14 1910 12/28/14 0537  HGB 11.8* 10.2*  HCT 35.8* 30.8*    Assessment/Plan:  ASSESSMENT: Maria BullaRamekquia I Ulbrich is a 19 y.o. [id number] 7288w1d s/p nsvd, doing well. GBS pos, not adequately treated.  Plan for discharge tomorrow   LOS: 1 day   Silvano Bilisoah B Jalayna Josten 12/28/2014, 6:20 AM

## 2014-12-28 NOTE — Anesthesia Postprocedure Evaluation (Signed)
  Anesthesia Post-op Note  Patient: Maria Sanders  Procedure(s) Performed: * No procedures listed *  Patient Location: Mother/Baby  Anesthesia Type:Epidural  Level of Consciousness: awake, alert , oriented and patient cooperative  Airway and Oxygen Therapy: Patient Spontanous Breathing  Post-op Pain: none  Post-op Assessment: Post-op Vital signs reviewed, Patient's Cardiovascular Status Stable, Respiratory Function Stable, Patent Airway, No headache, No backache and Patient able to bend at knees              Post-op Vital Signs: Reviewed and stable  Last Vitals:  Filed Vitals:   12/28/14 0713  BP: 118/76  Pulse: 66  Temp: 36.9 C  Resp: 18    Complications: No apparent anesthesia complications

## 2014-12-28 NOTE — Lactation Note (Signed)
This note was copied from the chart of Boy Lenora BoysRamekquia Luczak. Lactation Consultation Note  Patient Name: Boy Lenora BoysRamekquia Farabee UJWJX'BToday's Date: 12/28/2014 Reason for consult: Initial assessment Experienced bf mom that reports feedings are going well. She bf her 21 yr old son for 2 yr, he would not take a bottle so she wants to start formula early with this baby so that she can "have a break from him". Dicussed feeding frequency, baby belly size, breast changes, manual expression, and nipple care. She had questions about offering cow milk now. Given lactation handouts. She is aware of OP services and support group. She will call as needed for bf help.   Maternal Data Formula Feeding for Exclusion: No  Feeding Feeding Type: Breast Fed Length of feed: 10 min  LATCH Score/Interventions Latch: Grasps breast easily, tongue down, lips flanged, rhythmical sucking.  Audible Swallowing: Spontaneous and intermittent  Type of Nipple: Everted at rest and after stimulation  Comfort (Breast/Nipple): Soft / non-tender     Hold (Positioning): No assistance needed to correctly position infant at breast.  LATCH Score: 10  Lactation Tools [facility]ed/[facility]   Consult Status Consult Status: PRN    Rulon Eisenmengerlizabeth E Antonietta Lansdowne 12/28/2014, 4:43 PM

## 2014-12-28 NOTE — Progress Notes (Signed)
UR chart review completed.  

## 2014-12-29 DIAGNOSIS — O09899 Supervision of other high risk pregnancies, unspecified trimester: Secondary | ICD-10-CM | POA: Diagnosis present

## 2014-12-29 DIAGNOSIS — O99820 Streptococcus B carrier state complicating pregnancy: Secondary | ICD-10-CM

## 2014-12-29 DIAGNOSIS — Z283 Underimmunization status: Secondary | ICD-10-CM

## 2014-12-29 MED ORDER — INFLUENZA VAC SPLIT QUAD 0.5 ML IM SUSY
0.5000 mL | PREFILLED_SYRINGE | Freq: Once | INTRAMUSCULAR | Status: AC
  Administered 2014-12-29: 0.5 mL via INTRAMUSCULAR

## 2014-12-29 MED ORDER — IBUPROFEN 600 MG PO TABS: 600.0000 mg | ORAL_TABLET | Freq: Four times a day (QID) | ORAL | Status: AC

## 2014-12-29 MED ORDER — VARICELLA VIRUS VACCINE LIVE 1350 PFU/0.5ML IJ SUSR: 0.5000 mL | Freq: Once | INTRAMUSCULAR | Status: DC

## 2014-12-29 NOTE — Discharge Instructions (Signed)

## 2014-12-29 NOTE — Discharge Summary (Signed)
OB Discharge Summary     Patient Name: Maria Sanders DOB: 1995/12/12 MRN: [id number]  Date of admission: 12/27/2014 Delivering MD: Catalina AntiguaONSTANT, PEGGY   Date of discharge: 12/29/2014  Admitting diagnosis: 40 WKS, CTXS Intrauterine pregnancy: 5353w1d     Secondary diagnosis:  Principal Problem:   NSVD (normal spontaneous vaginal delivery) Active Problems:   Active labor   GBS (group B Streptococcus carrier), +RV culture, currently pregnant   Maternal varicella, non-immune  Additional problems: None     Discharge diagnosis: Term Pregnancy Delivered                                                                                                Post partum procedures:None  Augmentation: none.   Complications: None  Hospital course:  Onset of Labor With Vaginal Delivery     19 y.o. yo [id number] at 7553w1d was admitted in Active Laboron 12/27/2014. Patient had an uncomplicated labor course as follows:  Membrane Rupture Time/Date: 10:18 PM ,12/27/2014   Intrapartum Procedures: Episiotomy: None [1]                                         Lacerations:  None [1]  Patient had a delivery of a Viable infant. 12/27/2014  Information for the patient's newborn:  Maria Sanders, Boy [name] [[id number]][[id number]]  Delivery Method: Vaginal, Spontaneous Delivery (Filed from Delivery Summary)    Pateint had an uncomplicated postpartum course.  She is ambulating, tolerating a regular diet, passing flatus, and urinating well. Patient is discharged home in stable condition on 12/29/2014     Physical exam  Filed Vitals:   12/28/14 0713 12/28/14 1522 12/28/14 1734 12/29/14 0704  BP: 118/76 107/71 107/77 104/70  Pulse: 66 67 74 62  Temp: 98.4 F (36.9 C) 98.1 F (36.7 C) 98.3 F (36.8 C) 98.3 F (36.8 C)  TempSrc: Oral Oral Oral Oral  Resp: 18 17 18 18   Height:      Weight:      SpO2:       General: alert, cooperative and no distress Lochia: appropriate Uterine Fundus: firm Incision: N/A DVT  Evaluation: No evidence of DVT seen on physical exam. Labs: Lab Results  Component Value Date   WBC 14.1* 12/28/2014   HGB 10.2* 12/28/2014   HCT 30.8* 12/28/2014   MCV 92.5 12/28/2014   PLT 264 12/28/2014   CMP Latest Ref Rng 12/26/2014  Glucose 65 - 99 mg/dL 78  BUN 6 - 20 mg/dL 7  Creatinine 7.250.44 - 3.661.00 mg/dL 4.400.61  Sodium 347135 - 425145 mmol/L 137  Potassium 3.5 - 5.1 mmol/L 4.7  Chloride 101 - 111 mmol/L 106  CO2 22 - 32 mmol/L 23  Calcium 8.9 - 10.3 mg/dL 9.5    Discharge instruction: per After Visit Summary and "Baby and Me Booklet".  After visit meds:    Medication List    TAKE these medications        ibuprofen 600 MG tablet  Commonly known as:  ADVIL,MOTRIN  Take  1 tablet (600 mg total) by mouth every 6 (six) hours.     prenatal multivitamin Tabs tablet  Take 1 tablet by mouth daily.        Diet: routine diet  Activity: Advance as tolerated. Pelvic rest for 6 weeks.   Outpatient follow up:6 weeks Follow up Appt:No future appointments. Follow up Visit:No Follow-up on file.  Postpartum contraception: Depo Provera  Newborn Data: Live born female  Birth Weight: 6 lb 6 oz (2892 g) APGAR: 9, 9  Baby Feeding: Breast Disposition:home with mother   12/29/2014 Devota Pace MD  OB fellow attestation I have seen and examined this patient and agree with above documentation in the resident's note.   Maria Sanders is a 19 y.o. U9W1191 s/p NSVD.   Pain is well controlled.  Plan for birth control is Depo-Provera.  Method of Feeding: Breast  PE:  BP 104/70 mmHg  Pulse 62  Temp(Src) 98.3 F (36.8 C) (Oral)  Resp 18  Ht  (1.549 m)  Wt 118 lb (53.524 kg)  BMI 22.31 kg/m2  SpO2 98%  LMP 04/21/2014 (Approximate)  Breastfeeding? Unknown Gen: well appearing Heart: reg rate Lungs: normal WOB Fundus firm Ext: soft, no pain, no edema   Recent Labs  12/27/14 1910 12/28/14 0537  HGB 11.8* 10.2*  HCT 35.8* 30.8*   Plan: discharge today -  postpartum care discussed - f/u clinic in 6 weeks for postpartum visit Federico Flake, MD 7:36 AM  .

## 2014-12-29 NOTE — H&P (Signed)
LABOR AND DELIVERY ADMISSION HISTORY AND PHYSICAL NOTE  Maria Sanders is a 19 y.o. female G3P1011 with IUP at 3988w1d by 12-wk u/s presenting for contractions. Admitted last night and discharged a few hours later in latent labor. This PM much stronger contractions, every few minutes. She reports +FMs, No LOF, no VB, no blurry vision, headaches or peripheral edema, and RUQ pain.    Prenatal History/Complications:  Past Medical History: Past Medical History  Diagnosis Date  . Medical history non-contributory   . UTI (urinary tract infection)   . Infection     UTI    Past Surgical History: Past Surgical History  Procedure Laterality Date  . No past surgeries      Obstetrical History: OB History    Gravida Para Term Preterm AB TAB SAB Ectopic Multiple Living   3 1 1  1  1   1       Social History: Social History   Social History  . Marital Status: Single    Spouse Name: N/A  . Number of Children: N/A  . Years of Education: N/A   Social History Main Topics  . Smoking status: Former Smoker    Types: Cigarettes  . Smokeless tobacco: Never Used     Comment: quit in first trimester  . Alcohol Use: No  . Drug Use: No  . Sexual Activity: Yes    Birth Control/ Protection: None     Comment: last intercorse Sep 29, 2013   Other Topics Concern  . None   Social History Narrative    Family History: Family History  Problem Relation Age of Onset  . Asthma Son     Allergies: No Known Allergies  Prescriptions prior to admission  Medication Sig Dispense Refill Last Dose  . Prenatal Vit-Fe Fumarate-FA (PRENATAL MULTIVITAMIN) TABS tablet Take 1 tablet by mouth daily.   12/27/2014 at Unknown time     Review of Systems   All systems reviewed and negative except as stated in HPI  Blood pressure 122/77, pulse 84, temperature 97.5 F (36.4  C), temperature source Oral, resp. rate 20, last menstrual period 04/21/2014. General appearance: alert, cooperative, appears stated age and moderate distress Lungs: clear to auscultation bilaterally Heart: regular rate and rhythm Abdomen: soft, non-tender; bowel sounds normal Extremities: No calf swelling or tenderness Presentation: cephalic Fetal monitoring: 125/mod;/+a/-d Uterine activity: q 3-4 min  Dilation: 5.5 Effacement (%): 90 Station: -2 Exam by:: Dorrene GermanJ. Lowe RN   Prenatal labs: ABO, Rh: --/--/O POS (11/18 2250) Antibody: NEG (11/18 2250) Rubella: !Error!immune RPR: Non Reactive (11/18 2250)  HBsAg:   neg HIV: Non Reactive (05/10 1503)  GBS:   pos 1 hr Glucola 119 Genetic screening Quad neg Anatomy US wnl  Prenatal Transfer Tool  Maternal Diabetes: No Genetic Screening: Normal Maternal Ultrasounds/Referrals: Normal Fetal Ultrasounds or other Referrals: None Maternal Substance Abuse: No Significant Maternal Medications: Meds include: Other: macrobid Significant Maternal Lab Results: Lab values include: Group B Strep positive  Results for orders placed or performed during the hospital encounter of 12/26/14 (from the past 24 hour(s))  CBC   Collection Time: 12/26/14 10:50 PM  Result Value Ref Range   WBC 8.8 4.0 - 10.5 K/uL   RBC 3.60 (L) 3.87 - 5.11 MIL/uL   Hemoglobin 11.0 (L) 12.0 - 15.0 g/dL   HCT 16.134.1 (L) 09.636.0 - 04.546.0 %   MCV 94.7 78.0 - 100.0 fL   MCH 30.6 26.0 - 34.0 pg   MCHC 32.3 30.0 - 36.0 g/dL  RDW 13.8 11.5 - 15.5 %   Platelets 274 150 - 400 K/uL  RPR   Collection Time: 12/26/14 10:50 PM  Result Value Ref Range   RPR Ser Ql Non Reactive Non Reactive  Basic metabolic panel   Collection Time: 12/26/14 10:50 PM  Result Value Ref Range   Sodium 137 135 - 145 mmol/L   Potassium 4.7 3.5 - 5.1 mmol/L   Chloride 106 101 - 111 mmol/L   CO2 23 22 - 32 mmol/L    Glucose, Bld 78 65 - 99 mg/dL   BUN 7 6 - 20 mg/dL   Creatinine, Ser 1.61 0.44 - 1.00 mg/dL   Calcium 9.5 8.9 - 09.6 mg/dL   GFR calc non Af Amer >60 >60 mL/min   GFR calc Af Amer >60 >60 mL/min   Anion gap 8 5 - 15  Type and screen [facility]   Collection Time: 12/26/14 10:50 PM  Result Value Ref Range   ABO/RH(D) O POS    Antibody Screen NEG    Sample Expiration 12/29/2014     Patient Active Problem List   Diagnosis Date Noted  . Active labor at term 12/26/2014  . Encounter for fetal anatomic survey   . [redacted] weeks gestation of pregnancy     Assessment: Maria Sanders is a 19 y.o. G3P1011 at [redacted]w[redacted]d here for active labor, intact.  #Labor: expectant #Pain:epidural #FWB:Cat 1 #ID: gbs positive, starting pcn #MOF: breast #MOC: depo #Circ: No inpatient circ, maybe outpatient  Cherrie Gauze [facility] 12/27/2014, 7:13 PM     Addendum: please note this is a re-filing of the patient's h&P that was completed the day of delivery but incorrectly labeled as an MAU note

## 2015-04-22 ENCOUNTER — Inpatient Hospital Stay (HOSPITAL_COMMUNITY): Payer: Medicaid Other

## 2015-04-22 ENCOUNTER — Inpatient Hospital Stay (HOSPITAL_COMMUNITY)
Admission: AD | Admit: 2015-04-22 | Discharge: 2015-04-22 | Disposition: A | Discharge status: Home / Self Care | Payer: Medicaid Other | Source: Ambulatory Visit | Attending: Obstetrics and Gynecology | Admitting: Obstetrics and Gynecology

## 2015-04-22 ENCOUNTER — Encounter (HOSPITAL_COMMUNITY): Payer: Self-pay | Admitting: *Deleted

## 2015-04-22 DIAGNOSIS — Z3A08 8 weeks gestation of pregnancy: Secondary | ICD-10-CM | POA: Insufficient documentation

## 2015-04-22 DIAGNOSIS — O039 Complete or unspecified spontaneous abortion without complication: Secondary | ICD-10-CM | POA: Insufficient documentation

## 2015-04-22 DIAGNOSIS — Z87891 Personal history of nicotine dependence: Secondary | ICD-10-CM | POA: Diagnosis not present

## 2015-04-22 DIAGNOSIS — O209 Hemorrhage in early pregnancy, unspecified: Secondary | ICD-10-CM

## 2015-04-22 LAB — WET PREP, GENITAL
Clue Cells Wet Prep HPF POC: NONE SEEN
Sperm: NONE SEEN
TRICH WET PREP: NONE SEEN

## 2015-04-22 LAB — CBC
HCT: 38.9 % (ref 36.0–46.0)
HEMATOCRIT: 31.1 % — AB (ref 36.0–46.0)
Hemoglobin: 13.1 g/dL (ref 12.0–15.0)
Hemoglobin: 10.5 g/dL — ABNORMAL LOW (ref 12.0–15.0)
MCH: 30.5 pg (ref 26.0–34.0)
MCH: 30.3 pg (ref 26.0–34.0)
MCHC: 33.7 g/dL (ref 30.0–36.0)
MCHC: 33.8 g/dL (ref 30.0–36.0)
MCV: 90.7 fL (ref 78.0–100.0)
MCV: 89.6 fL (ref 78.0–100.0)
PLATELETS: 322 10*3/uL (ref 150–400)
Platelets: 296 10*3/uL (ref 150–400)
RBC: 4.29 MIL/uL (ref 3.87–5.11)
RBC: 3.47 MIL/uL — ABNORMAL LOW (ref 3.87–5.11)
RDW: 13.3 % (ref 11.5–15.5)
RDW: 13.4 % (ref 11.5–15.5)
WBC: 13.1 10*3/uL — ABNORMAL HIGH (ref 4.0–10.5)
WBC: 19.8 10*3/uL — ABNORMAL HIGH (ref 4.0–10.5)

## 2015-04-22 LAB — DIFFERENTIAL
Basophils Absolute: 0 10*3/uL (ref 0.0–0.1)
Basophils Relative: 0 %
EOS PCT: 0 %
Eosinophils Absolute: 0 10*3/uL (ref 0.0–0.7)
LYMPHS ABS: 1.4 10*3/uL (ref 0.7–4.0)
LYMPHS PCT: 7 %
Monocytes Absolute: 1.1 10*3/uL — ABNORMAL HIGH (ref 0.1–1.0)
Monocytes Relative: 6 %
NEUTROS ABS: 16.9 10*3/uL — AB (ref 1.7–7.7)
NEUTROS PCT: 87 %

## 2015-04-22 LAB — URINE MICROSCOPIC-ADD ON

## 2015-04-22 LAB — URINALYSIS, ROUTINE W REFLEX MICROSCOPIC
BILIRUBIN URINE: NEGATIVE
Glucose, UA: NEGATIVE mg/dL
Ketones, ur: NEGATIVE mg/dL
Leukocytes, UA: NEGATIVE
Nitrite: NEGATIVE
PH: 5 (ref 5.0–8.0)
Protein, ur: 30 mg/dL — AB
SPECIFIC GRAVITY, URINE: 1.025 (ref 1.005–1.030)

## 2015-04-22 LAB — HCG, QUANTITATIVE, PREGNANCY: HCG, BETA CHAIN, QUANT, S: 133227 m[IU]/mL — AB (ref <5)

## 2015-04-22 LAB — POCT PREGNANCY, URINE: Preg Test, Ur: POSITIVE — AB

## 2015-04-22 MED ORDER — LACTATED RINGERS IV BOLUS (SEPSIS)
1000.0000 mL | Freq: Once | INTRAVENOUS | Status: AC
  Administered 2015-04-22: 1000 mL via INTRAVENOUS

## 2015-04-22 MED ORDER — PROMETHAZINE HCL 25 MG/ML IJ SOLN
12.5000 mg | Freq: Once | INTRAMUSCULAR | Status: AC
  Administered 2015-04-22: 12.5 mg via INTRAVENOUS
  Filled 2015-04-22: qty 1

## 2015-04-22 MED ORDER — HYDROMORPHONE HCL 1 MG/ML IJ SOLN
1.0000 mg | Freq: Once | INTRAMUSCULAR | Status: AC
Start: 2015-04-22 — End: 2015-04-22
  Administered 2015-04-22: 1 mg via INTRAVENOUS
  Filled 2015-04-22: qty 1

## 2015-04-22 MED ORDER — OXYCODONE-ACETAMINOPHEN 5-325 MG PO TABS: 1.0000 | ORAL_TABLET | Freq: Four times a day (QID) | ORAL | Status: AC | PRN

## 2015-04-22 MED ORDER — LACTATED RINGERS IV SOLN: INTRAVENOUS | Status: DC

## 2015-04-22 NOTE — MAU Provider Note (Signed)
History     CSN: [id number]  Arrival date and time: 04/22/15 4098   First Provider Initiated Contact with Patient 04/22/15 1007      Chief Complaint  Patient presents with  . Threatened Miscarriage   HPI Maria Sanders is a 20 y.o. [phone] at [redacted]w[redacted]d who presents for vaginal bleeding.  Symptoms started 2 hours PTA. States she passed the baby and started bleeding heavy with clots. Also reports lower abdominal pain she rates 10/10.   OB History    Gravida Para Term Preterm AB TAB SAB Ectopic Multiple Living   0 2      Past Medical History  Diagnosis Date  . Medical history non-contributory   . UTI (urinary tract infection)   . Infection     UTI    Past Surgical History  Procedure Laterality Date  . No past surgeries      Family History  Problem Relation Age of Onset  . Asthma Son     Social History  Substance Use Topics  . Smoking status: Former Smoker    Types: Cigarettes  . Smokeless tobacco: Never Used     Comment: quit in first trimester  . Alcohol Use: No    Allergies: No Known Allergies  Prescriptions prior to admission  Medication Sig Dispense Refill Last Dose  . ibuprofen (ADVIL,MOTRIN) 600 MG tablet Take 1 tablet (600 mg total) by mouth every 6 (six) hours. 30 tablet 0   . Prenatal Vit-Fe Fumarate-FA (PRENATAL MULTIVITAMIN) TABS tablet Take 1 tablet by mouth daily.   12/27/2014 at Unknown time    Review of Systems  Constitutional: Negative.   Cardiovascular: Negative.   Gastrointestinal: Positive for abdominal pain. Negative for nausea, vomiting, diarrhea and constipation.  Genitourinary:       + vaginal bleeding  Neurological: Positive for dizziness. Negative for loss of consciousness.   Physical Exam   Blood pressure 101/61, pulse 98, temperature 98.5 F (36.9 C), temperature source Oral, resp. rate 18, height 5' (1.524 m), weight 95 lb 9.6 oz (43.364 kg), last menstrual period 02/22/2015, SpO2 100 %, unknown if currently  breastfeeding.  Orthostatic VS for the past 24 hrs:  BP- Lying Pulse- Lying BP- Sitting Pulse- Sitting BP- Standing at 0 minutes Pulse- Standing at 0 minutes  04/22/15 1644 117/63 mmHg 95 105/57 mmHg 95 108/61 mmHg 118  04/22/15 1401 94/48 mmHg 93 115/78 mmHg 121 115/64 mmHg 152  04/22/15 1221 (!) 74/56 mmHg 108 115/65 mmHg 88 101/63 mmHg 119  04/22/15 0948 100/59 mmHg 117 107/78 mmHg 124 (!) 88/64 mmHg 131      Physical Exam  Nursing note and vitals reviewed. Constitutional: She is oriented to person, place, and time. She appears well-developed and well-nourished. She appears distressed.  HENT:  Head: Normocephalic and atraumatic.  Eyes: Conjunctivae are normal. Right eye exhibits no discharge. Left eye exhibits no discharge. No scleral icterus.  Neck: Normal range of motion.  Cardiovascular: Normal rate, regular rhythm and normal heart sounds.   No murmur heard. Respiratory: Effort normal and breath sounds normal. No respiratory distress. She has no wheezes.  GI: Soft. She exhibits no distension. There is tenderness.  Genitourinary:  Moderate amount of dark red blood in vagina. Several large clots removed from vagina.  Cervix fingertip.  Pt unable to tolerate rest of bimanual exam  Neurological: She is alert and oriented to person, place, and time.  Skin: Skin is warm and dry. She is not  diaphoretic.  Psychiatric: She has a normal mood and affect. Her behavior is normal. Judgment and thought content normal.    MAU Course  Procedures Results for orders placed or performed during the hospital encounter of 04/22/15 (from the past 24 hour(s))  Urinalysis, Routine w reflex microscopic ([facility])     Status: Abnormal   Collection Time: 04/22/15  9:30 AM  Result Value Ref Range   Color, Urine RED (A) YELLOW   APPearance CLOUDY (A) CLEAR   Specific Gravity, Urine 1.025 1.005 - 1.030   pH 5.0 5.0 - 8.0   Glucose, UA NEGATIVE NEGATIVE mg/dL   Hgb urine dipstick LARGE (A)  NEGATIVE   Bilirubin Urine NEGATIVE NEGATIVE   Ketones, ur NEGATIVE NEGATIVE mg/dL   Protein, ur 30 (A) NEGATIVE mg/dL   Nitrite NEGATIVE NEGATIVE   Leukocytes, UA NEGATIVE NEGATIVE  Urine microscopic-add on     Status: Abnormal   Collection Time: 04/22/15  9:30 AM  Result Value Ref Range   Squamous Epithelial / LPF 0-5 (A) NONE SEEN   WBC, UA 0-5 0 - 5 WBC/hpf   RBC / HPF TOO NUMEROUS TO COUNT 0 - 5 RBC/hpf   Bacteria, UA FEW (A) NONE SEEN  Pregnancy, urine POC     Status: Abnormal   Collection Time: 04/22/15  9:35 AM  Result Value Ref Range   Preg Test, Ur POSITIVE (A) NEGATIVE  CBC     Status: Abnormal   Collection Time: 04/22/15  9:41 AM  Result Value Ref Range   WBC 13.1 (H) 4.0 - 10.5 K/uL   RBC 4.29 3.87 - 5.11 MIL/uL   Hemoglobin 13.1 12.0 - 15.0 g/dL   HCT 78.238.9 95.636.0 - 21.346.0 %   MCV 90.7 78.0 - 100.0 fL   MCH 30.5 26.0 - 34.0 pg   MCHC 33.7 30.0 - 36.0 g/dL   RDW 08.613.3 57.811.5 - 46.915.5 %   Platelets 322 150 - 400 K/uL  hCG, quantitative, pregnancy     Status: Abnormal   Collection Time: 04/22/15  9:41 AM  Result Value Ref Range   hCG, Beta Chain, Quant, S 133227 (H) <5 mIU/mL  CBC     Status: Abnormal   Collection Time: 04/22/15  3:55 PM  Result Value Ref Range   WBC 19.8 (H) 4.0 - 10.5 K/uL   RBC 3.47 (L) 3.87 - 5.11 MIL/uL   Hemoglobin 10.5 (L) 12.0 - 15.0 g/dL   HCT 62.931.1 (L) 52.836.0 - 41.346.0 %   MCV 89.6 78.0 - 100.0 fL   MCH 30.3 26.0 - 34.0 pg   MCHC 33.8 30.0 - 36.0 g/dL   RDW 24.413.4 01.011.5 - 27.215.5 %   Platelets 296 150 - 400 K/uL  Differential     Status: Abnormal   Collection Time: 04/22/15  3:55 PM  Result Value Ref Range   Neutrophils Relative % 87 %   Neutro Abs 16.9 (H) 1.7 - 7.7 K/uL   Lymphocytes Relative 7 %   Lymphs Abs 1.4 0.7 - 4.0 K/uL   Monocytes Relative 6 %   Monocytes Absolute 1.1 (H) 0.1 - 1.0 K/uL   Eosinophils Relative 0 %   Eosinophils Absolute 0.0 0.0 - 0.7 K/uL   Basophils Relative 0 %   Basophils Absolute 0.0 0.0 - 0.1 K/uL  Wet prep,  genital     Status: Abnormal   Collection Time: 04/22/15  4:41 PM  Result Value Ref Range   Yeast Wet Prep HPF POC PRESENT (A) NONE  SEEN   Trich, Wet Prep NONE SEEN NONE SEEN   Clue Cells Wet Prep HPF POC NONE SEEN NONE SEEN   WBC, Wet Prep HPF POC FEW (A) NONE SEEN   Sperm NONE SEEN    US Ob Comp Less 14 Wks  04/22/2015  CLINICAL DATA:  Pain and vaginal bleeding for several hours. Positive beta HCG. EXAM: OBSTETRIC <14 WK [place] AND TRANSVAGINAL OB US TECHNIQUE: Both transabdominal and transvaginal ultrasound examinations were performed for complete evaluation of the gestation as well as the maternal uterus, adnexal regions, and pelvic cul-de-sac. Transvaginal technique was performed to assess early pregnancy. COMPARISON:  None. FINDINGS: Intrauterine gestational sac: Not visualized Yolk sac:  Not visualized Embryo:  Not visualized Cardiac Activity: Not visualized Subchorionic hemorrhage:  None visualized. Maternal uterus/adnexae: There is no intrauterine mass. The endometrium appears inhomogeneous and thickened. Right ovary measures 2.7 x 1.2 x 1.8 cm. Left ovary measures 2.9 x 2.3 x 2.3 cm. There is no extrauterine pelvic or adnexal mass. No free pelvic fluid. IMPRESSION: No intrauterine gestation is apparent. The endometrium is thickened and inhomogeneous, likely due to acute hemorrhage. This appearance is concerning for recent spontaneous abortion. In this circumstance, differential considerations must include intrauterine gestation too early to be seen by either transabdominal or transvaginal technique or possibly ectopic gestation. In this regard, clinical assessment as well as serial beta HCG evaluations are advised. Repeat ultrasound timing will in part be dependent on beta HCG laboratory values. The appearance of the endometrium will warrant additional imaging surveillance. The possibility of incomplete spontaneous abortion cannot be excluded sonographically given the inhomogeneous appearance of the  endometrium currently. Electronically Signed   By: Bretta Bang III M.D.   On: 04/22/2015 11:10   US Ob Transvaginal  04/22/2015  CLINICAL DATA:  Pain and vaginal bleeding for several hours. Positive beta HCG. EXAM: OBSTETRIC <14 WK [place] AND TRANSVAGINAL OB US TECHNIQUE: Both transabdominal and transvaginal ultrasound examinations were performed for complete evaluation of the gestation as well as the maternal uterus, adnexal regions, and pelvic cul-de-sac. Transvaginal technique was performed to assess early pregnancy. COMPARISON:  None. FINDINGS: Intrauterine gestational sac: Not visualized Yolk sac:  Not visualized Embryo:  Not visualized Cardiac Activity: Not visualized Subchorionic hemorrhage:  None visualized. Maternal uterus/adnexae: There is no intrauterine mass. The endometrium appears inhomogeneous and thickened. Right ovary measures 2.7 x 1.2 x 1.8 cm. Left ovary measures 2.9 x 2.3 x 2.3 cm. There is no extrauterine pelvic or adnexal mass. No free pelvic fluid. IMPRESSION: No intrauterine gestation is apparent. The endometrium is thickened and inhomogeneous, likely due to acute hemorrhage. This appearance is concerning for recent spontaneous abortion. In this circumstance, differential considerations must include intrauterine gestation too early to be seen by either transabdominal or transvaginal technique or possibly ectopic gestation. In this regard, clinical assessment as well as serial beta HCG evaluations are advised. Repeat ultrasound timing will in part be dependent on beta HCG laboratory values. The appearance of the endometrium will warrant additional imaging surveillance. The possibility of incomplete spontaneous abortion cannot be excluded sonographically given the inhomogeneous appearance of the endometrium currently. Electronically Signed   By: Bretta Bang III M.D.   On: 04/22/2015 11:10    MDM O positive Patient brought in tissue she passed - appears to be ~8 week formed  fetus. Sent to pathology.  IV fluids LR x 2 bags Dilaudid 1 mg - pain resolved Ultrasound shows no IUP or retained products CBC repeated d/t patient's tachycardia. Drop in  hemoglobin likely d/t 2 bags of IV fluids.  Bleeding has dramatically improved & patient able to ambulate to the bathroom.   Assessment and Plan  A: 1. Complete miscarriage   2. Vaginal bleeding in pregnancy, first trimester     P: Discharge home Rx percocet Pt to f/u in clinic in 2 weeks Discussed reasons to return - esp. Fever, heavy bleeding, worsening abdominal pain GC/CT pending Surgical pathology pending  Judeth Horn 04/22/2015, 10:09 AM

## 2015-04-22 NOTE — Discharge Instructions (Signed)
Return to care  °· If you have heavier bleeding that soaks through more that 2 pads per hour for an hour or more °· If you bleed so much that you feel like you might pass out or you do pass out °· If you have significant abdominal pain that is not improved with Tylenol  °· If you develop a fever > 100.5 °·  ° ° ° ° °Miscarriage °A miscarriage is the sudden loss of an unborn baby (fetus) before the 20th week of pregnancy. Most miscarriages happen in the first 3 months of pregnancy. Sometimes, it happens before a woman even knows she is pregnant. A miscarriage is also called a "spontaneous miscarriage" or "early pregnancy loss." Having a miscarriage can be an emotional experience. Talk with your caregiver about any questions you may have about miscarrying, the grieving process, and your future pregnancy plans. °CAUSES  °· Problems with the fetal chromosomes that make it impossible for the baby to develop normally. Problems with the baby's genes or chromosomes are most often the result of errors that occur, by chance, as the embryo divides and grows. The problems are not inherited from the parents. °· Infection of the cervix or uterus.   °· Hormone problems.   °· Problems with the cervix, such as having an incompetent cervix. This is when the tissue in the cervix is not strong enough to hold the pregnancy.   °· Problems with the uterus, such as an abnormally shaped uterus, uterine fibroids, or congenital abnormalities.   °· Certain medical conditions.   °· Smoking, drinking alcohol, or taking illegal drugs.   °· Trauma.   °Often, the cause of a miscarriage is unknown.  °SYMPTOMS  °· Vaginal bleeding or spotting, with or without cramps or pain. °· Pain or cramping in the abdomen or lower back. °· Passing fluid, tissue, or blood clots from the vagina. °DIAGNOSIS  °Your caregiver will perform a physical exam. You may also have an ultrasound to confirm the miscarriage. Blood or urine tests may also be ordered. °TREATMENT    °· Sometimes, treatment is not necessary if you naturally pass all the fetal tissue that was in the uterus. If some of the fetus or placenta remains in the body (incomplete miscarriage), tissue left behind may become infected and must be removed. Usually, a dilation and curettage (D and C) procedure is performed. During a D and C procedure, the cervix is widened (dilated) and any remaining fetal or placental tissue is gently removed from the uterus. °· Antibiotic medicines are prescribed if there is an infection. Other medicines may be given to reduce the size of the uterus (contract) if there is a lot of bleeding. °· If you have Rh negative blood and your baby was Rh positive, you will need a Rh immunoglobulin shot. This shot will protect any future baby from having Rh blood problems in future pregnancies. °HOME CARE INSTRUCTIONS  °· Your caregiver may order bed rest or may allow you to continue light activity. Resume activity as directed by your caregiver. °· Have someone help with home and family responsibilities during this time.   °· Keep track of the number of sanitary pads you use each day and how soaked (saturated) they are. Write down this information.   °· Do not use tampons. Do not douche or have sexual intercourse until approved by your caregiver.   °· Only take over-the-counter or prescription medicines for pain or discomfort as directed by your caregiver.   °· Do not take aspirin. Aspirin can cause bleeding.   °· Keep all   follow-up appointments with your caregiver.   If you or your partner have problems with grieving, talk to your caregiver or seek counseling to help cope with the pregnancy loss. Allow enough time to grieve before trying to get pregnant again.  SEEK IMMEDIATE MEDICAL CARE IF:  6. You have severe cramps or pain in your back or abdomen. 7. You have a fever. 8. You pass large blood clots (walnut-sized or larger) ortissue from your vagina. Save any tissue for your caregiver to  inspect.  9. Your bleeding increases.  10. You have a thick, bad-smelling vaginal discharge. 11. You become lightheaded, weak, or you faint.  12. You have chills.  MAKE SURE YOU:  Understand these instructions.  Will watch your condition.  Will get help right away if you are not doing well or get worse.   This information is not intended to replace advice given to you by your health care provider. Make sure you discuss any questions you have with your health care provider.   Document Released: 07/20/2000 Document Revised: 05/21/2012 Document Reviewed: 03/15/2011 [employer] Interactive Patient Education [employer]

## 2015-04-22 NOTE — MAU Note (Addendum)
+  HPT 2 wks ago.  Started bleeding about an hour ago., some cramping. Bled through clothes, taken to bathroom to change

## 2015-04-23 LAB — GC/CHLAMYDIA PROBE AMP (~~LOC~~) NOT AT ARMC
CHLAMYDIA, DNA PROBE: POSITIVE — AB
Neisseria Gonorrhea: NEGATIVE

## 2015-04-27 ENCOUNTER — Encounter (HOSPITAL_COMMUNITY): Payer: Self-pay | Admitting: *Deleted

## 2015-05-08 ENCOUNTER — Encounter: Payer: Self-pay | Admitting: Obstetrics & Gynecology

## 2015-05-08 ENCOUNTER — Encounter: Payer: Medicaid Other | Admitting: Obstetrics & Gynecology

## 2015-11-03 ENCOUNTER — Encounter: Payer: Self-pay | Admitting: *Deleted

## 2016-02-25 ENCOUNTER — Encounter (HOSPITAL_COMMUNITY): Payer: Self-pay

## 2016-04-27 IMAGING — US US OB COMP LESS 14 WK
1 series · 14 of 28 positions shown · non-contrast
Comparison: 01/23/2014.

CLINICAL DATA: Abdominal cramping.

EXAM:
OBSTETRIC <14 WK ULTRASOUND
TECHNIQUE: Transabdominal ultrasound was performed for evaluation of the
gestation as well as the maternal uterus and adnexal regions.

[Series 1: us ob comp less 14 wk · 32 acquisitions, 14 frames shown]
[im 2/32]
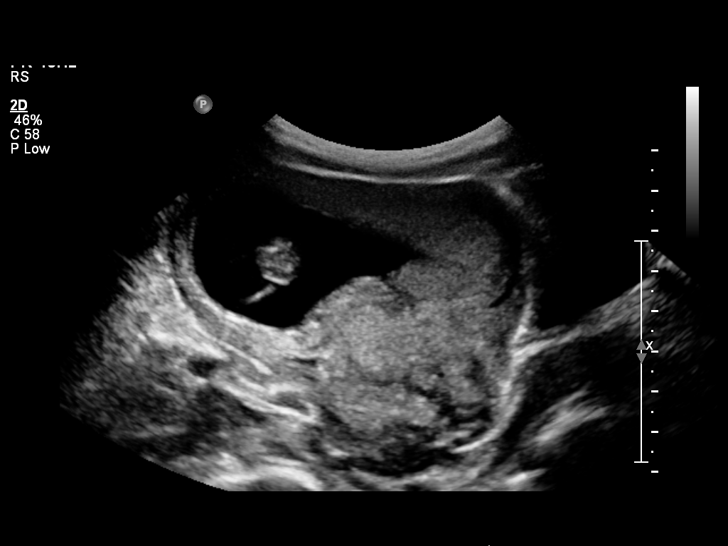
[im 4/32]
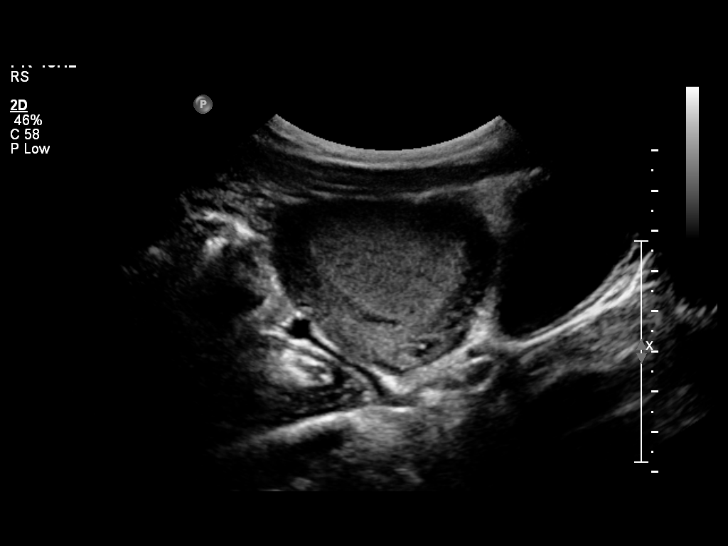
[im 6/32]
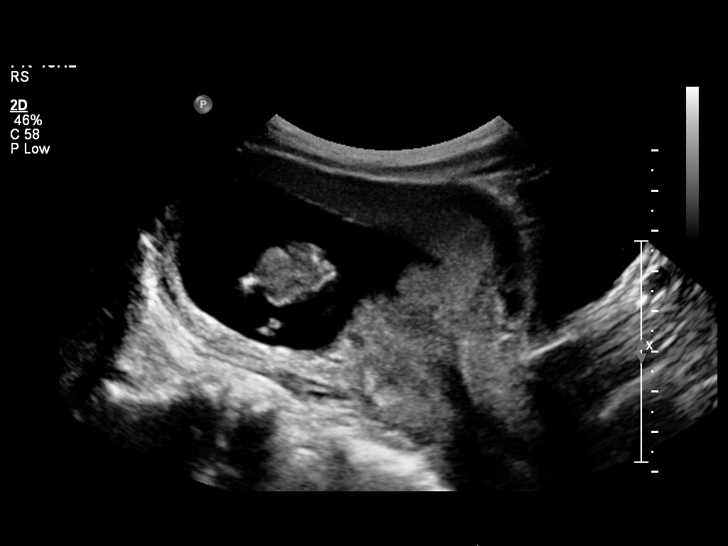
[im 9/32]
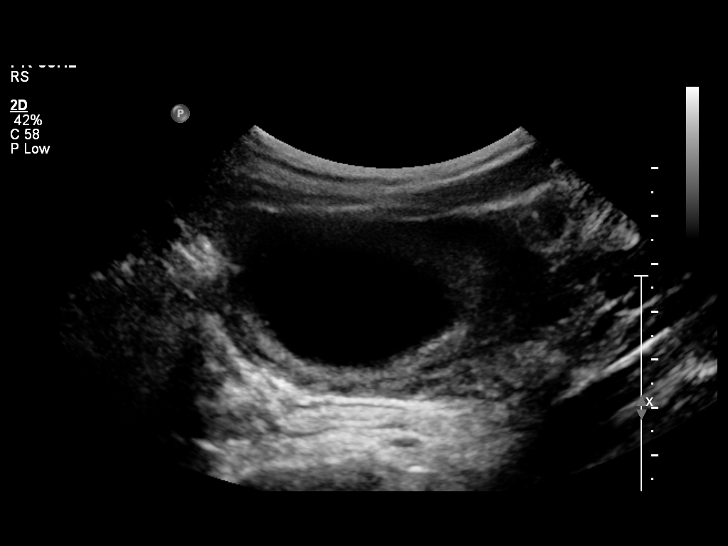
[im 11/32]
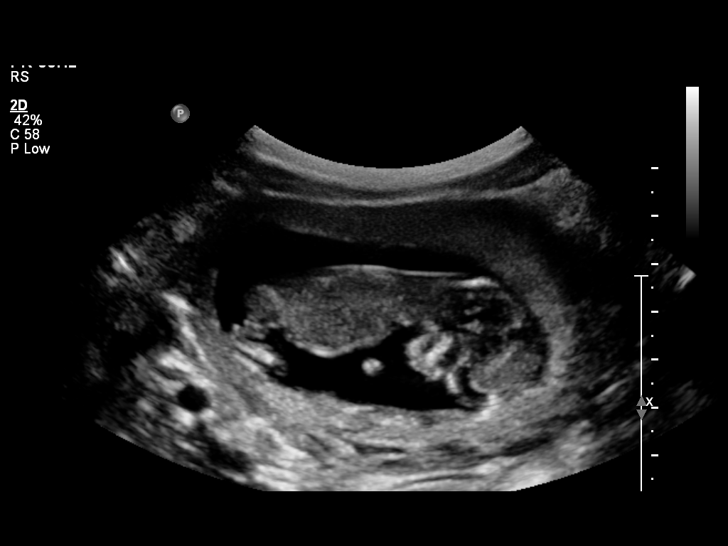
[im 13/32]
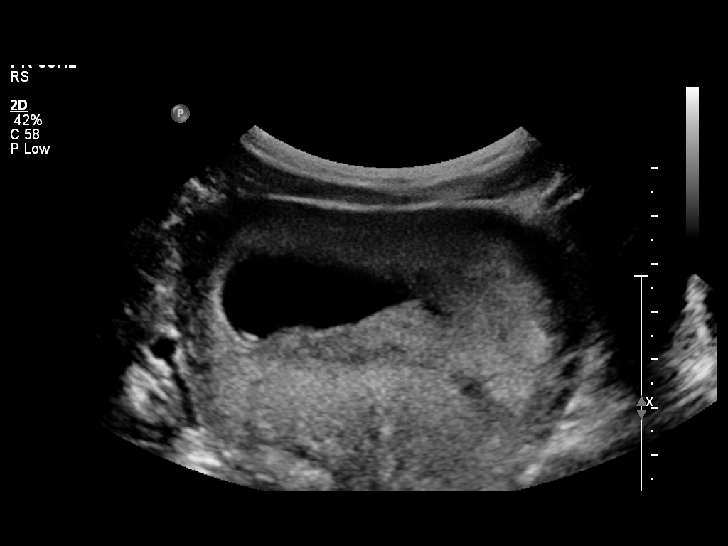
[im 15/32]
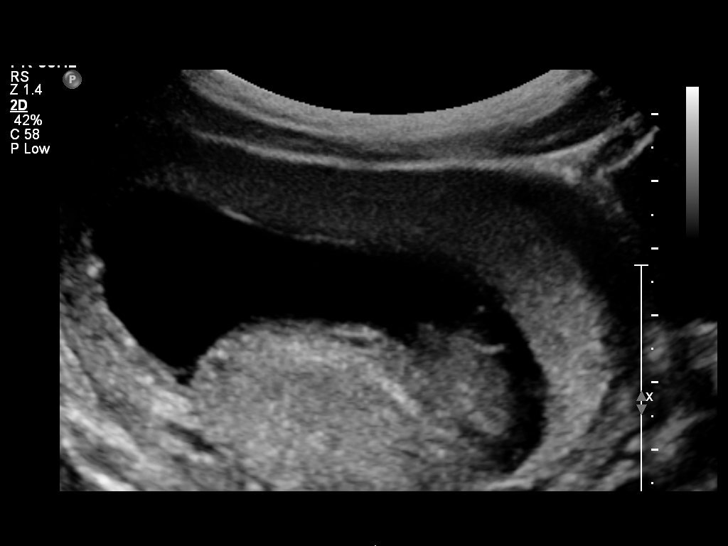
[im 18/32]
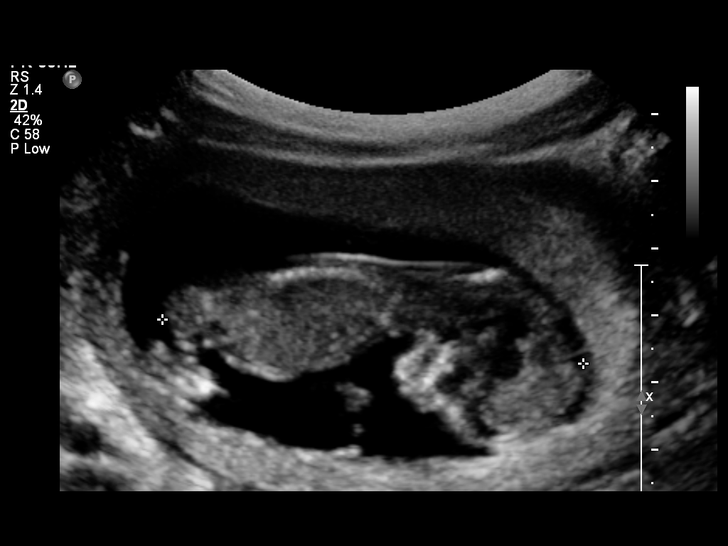
[im 20/32]
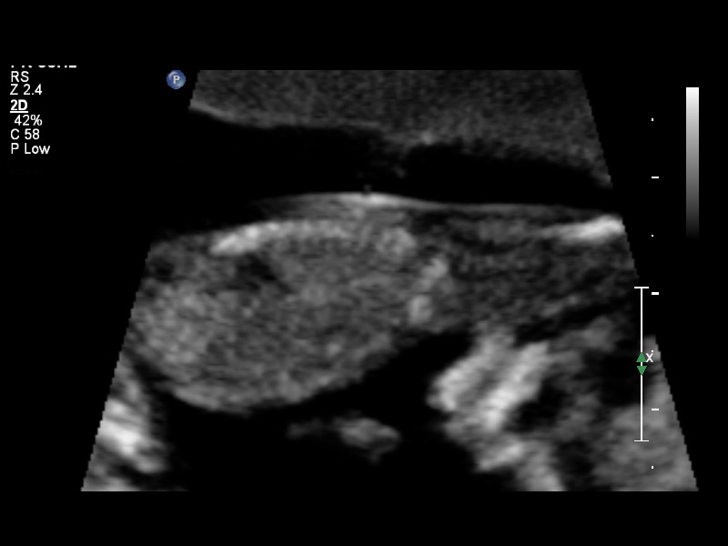
[im 22/32]
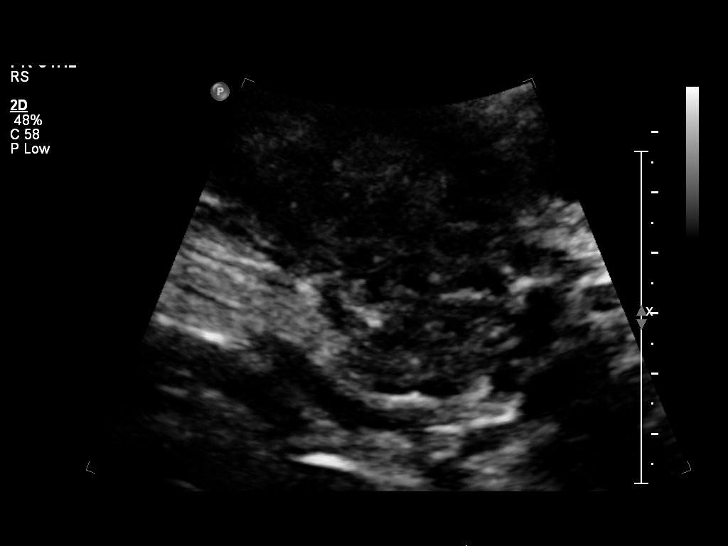
[im 25/32]
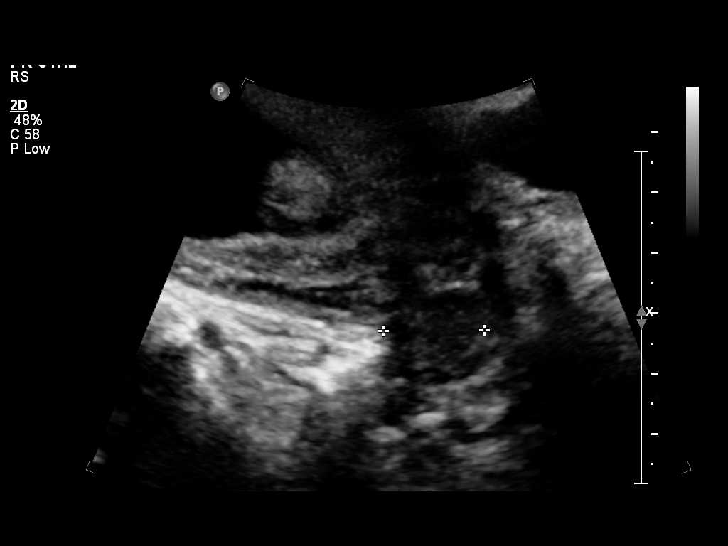
[im 27/32]
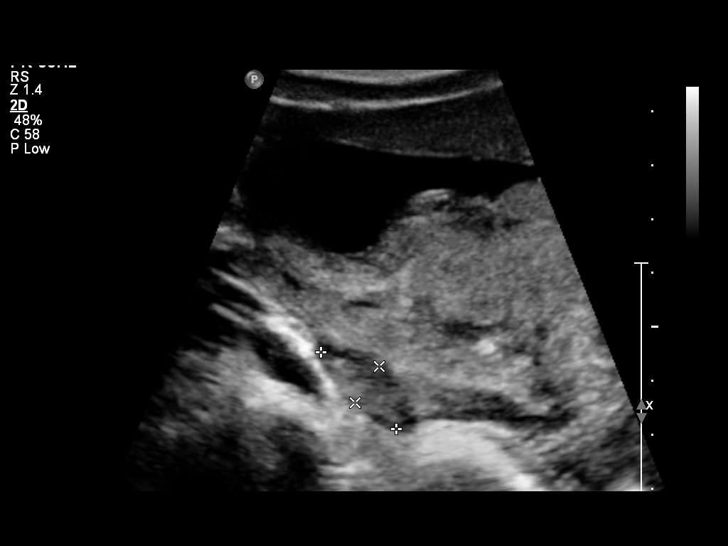
[im 29/32]
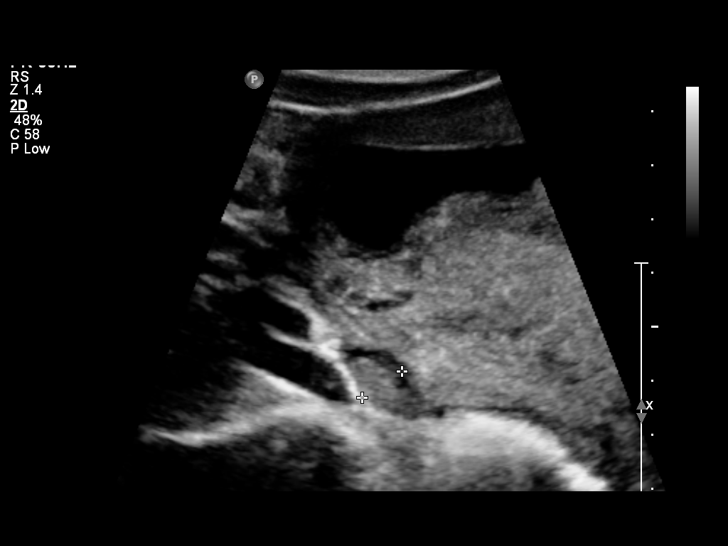
[im 32/32]
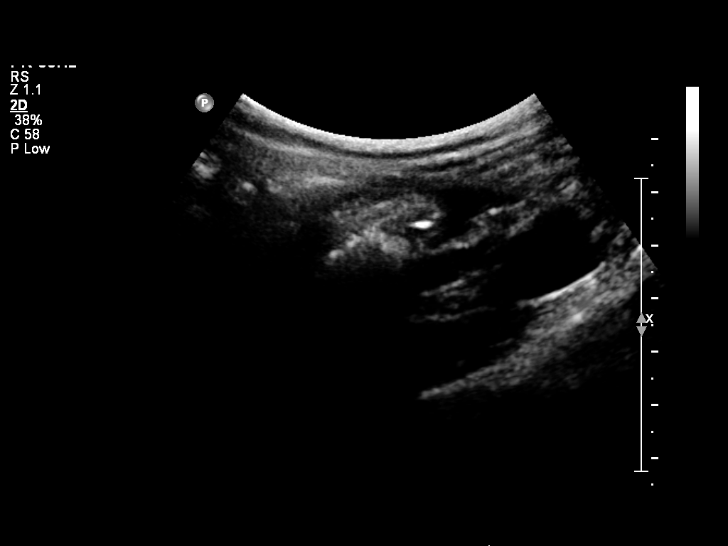

[14 of 28 positions shown; findings below may reference images not displayed]

FINDINGS: Intrauterine gestational sac: Visualized/normal in shape.

Yolk sac:  Present

Embryo:  Present

Cardiac Activity: Present

Heart Rate: 172 bpm

CRL:   60.5  mm   12 w 4 d                  US EDC: 12/26/2014

Maternal uterus/adnexae: No subchorionic hemorrhage. The ovaries
have a normal physiologic appearance. RIGHT ovary measures 20 mm x 8
mm x 9 mm. LEFT ovary measures 29 mm x 13 mm x 17 mm.
IMPRESSION: Uncomplicated single intrauterine pregnancy.

## 2018-06-21 ENCOUNTER — Encounter: Payer: Self-pay | Admitting: *Deleted
